# Patient Record
Sex: Female | Born: 1970 | Race: White | Hispanic: No | Marital: Married | Smoking: Former smoker
Health system: Southern US, Community
[De-identification: ages and names within clinical notes are randomized; demographics above are authoritative.]

## PROBLEM LIST (undated history)

## (undated) DIAGNOSIS — F419 Anxiety disorder, unspecified: Secondary | ICD-10-CM

## (undated) DIAGNOSIS — I7774 Dissection of vertebral artery: Secondary | ICD-10-CM

## (undated) DIAGNOSIS — E079 Disorder of thyroid, unspecified: Secondary | ICD-10-CM

## (undated) DIAGNOSIS — F32A Depression, unspecified: Secondary | ICD-10-CM

## (undated) HISTORY — DX: Depression, unspecified: F32.A

## (undated) HISTORY — DX: Disorder of thyroid, unspecified: E07.9

## (undated) HISTORY — DX: Anxiety disorder, unspecified: F41.9

---

## 2020-08-20 DIAGNOSIS — H547 Unspecified visual loss: Secondary | ICD-10-CM | POA: Insufficient documentation

## 2020-08-20 DIAGNOSIS — G43909 Migraine, unspecified, not intractable, without status migrainosus: Secondary | ICD-10-CM | POA: Insufficient documentation

## 2020-08-20 DIAGNOSIS — R112 Nausea with vomiting, unspecified: Secondary | ICD-10-CM | POA: Insufficient documentation

## 2020-10-29 LAB — BASIC METABOLIC PANEL
BUN: 10 (ref 4–21)
CO2: 25 — AB (ref 13–22)
Chloride: 104 (ref 99–108)
Creatinine: 0.8 (ref 0.5–1.1)
Glucose: 102
Potassium: 3.9 (ref 3.4–5.3)
Sodium: 139 (ref 137–147)

## 2020-10-29 LAB — CBC AND DIFFERENTIAL
HCT: 41 (ref 36–46)
Hemoglobin: 13 (ref 12.0–16.0)
Platelets: 375 (ref 150–399)
WBC: 7.5

## 2020-10-29 LAB — CBC: RBC: 4.59 (ref 3.87–5.11)

## 2020-10-29 LAB — COMPREHENSIVE METABOLIC PANEL
Albumin: 4.1 (ref 3.5–5.0)
Calcium: 9.6 (ref 8.7–10.7)

## 2020-10-29 LAB — HEPATIC FUNCTION PANEL: Bilirubin, Total: 0.2

## 2021-02-26 DIAGNOSIS — M542 Cervicalgia: Secondary | ICD-10-CM | POA: Insufficient documentation

## 2021-09-05 ENCOUNTER — Encounter: Payer: Self-pay | Admitting: Medical-Surgical

## 2021-09-05 ENCOUNTER — Ambulatory Visit (INDEPENDENT_AMBULATORY_CARE_PROVIDER_SITE_OTHER): Payer: Federal, State, Local not specified - PPO | Admitting: Medical-Surgical

## 2021-09-05 VITALS — BP 122/84 | HR 80 | Resp 20 | Ht 68.0 in | Wt 187.4 lb

## 2021-09-05 DIAGNOSIS — L989 Disorder of the skin and subcutaneous tissue, unspecified: Secondary | ICD-10-CM | POA: Diagnosis not present

## 2021-09-05 DIAGNOSIS — Z7689 Persons encountering health services in other specified circumstances: Secondary | ICD-10-CM | POA: Diagnosis not present

## 2021-09-05 DIAGNOSIS — Z23 Encounter for immunization: Secondary | ICD-10-CM

## 2021-09-05 DIAGNOSIS — I7774 Dissection of vertebral artery: Secondary | ICD-10-CM | POA: Diagnosis not present

## 2021-09-05 NOTE — Progress Notes (Signed)
New Patient Office Visit  Subjective:  Patient ID: Shelly Walton, female    DOB: 23-Nov-1970  Age: 50 y.o. MRN: [id number]  CC:  Chief Complaint  Patient presents with   Establish Care     HPI Shelly Walton presents to establish care. She is a very pleasant 50 year old female who recently relocated from [place], [place].  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  She has been under regular care by her general practitioner and vascular surgeon over the last year.  She is due for follow-up and has an appointment with Dr. Zella Ball with vascular surgery at [facility] scheduled for next week.  To satisfy insurance, she would like to have a referral placed today.  Reports that she had COVID about 3 months ago.  Since then she has had an increase in hair loss.  She does have a history of hypothyroidism that was previously treated for about 5 years.  She is not currently treated for this.  Has had about 15 pounds weight gain in the last year but wonders if this is related to inactivity or if it is more related to possible thyroid derangement.  Would like to have her thyroid rechecked when she comes back to do her physical.  Noticed a skin lesion that has come up on her right upper chest.  It has been there for only 1 week and is not itchy or painful.  She would like to be referred to dermatology since she has never seen dermatology and would like to have a full skin survey.  History reviewed. No pertinent past medical history.  History reviewed. No pertinent surgical history.  History reviewed. No pertinent family history.  Social History   Socioeconomic History   Marital status: Not on file    Spouse name: Not on file   Number of children: Not on file   Years of education: Not on file   Highest education level: Not on file  Occupational History   Not on file  Tobacco Use   Smoking status: Not on file   Smokeless tobacco: Not on  file  Substance and Sexual Activity   Alcohol use: Not on file   Drug use: Not on file   Sexual activity: Not on file  Other Topics Concern   Not on file  Social History Narrative   Not on file   Social Determinants of Health   Financial Resource Strain: Not on file  Food Insecurity: Not on file  Transportation Needs: Not on file  Physical Activity: Not on file  Stress: Not on file  Social Connections: Not on file  Intimate Partner Violence: Not on file    ROS Review of Systems  Constitutional:  Positive for unexpected weight change. Negative for chills, fatigue and fever.  HENT:  Negative for congestion, rhinorrhea, sinus pressure and sore throat.   Respiratory:  Negative for cough, chest tightness and shortness of breath.   Cardiovascular:  Negative for chest pain, palpitations and leg swelling.  Gastrointestinal:  Negative for abdominal pain, constipation, diarrhea, nausea and vomiting.  Endocrine: Negative for cold intolerance and heat intolerance.  Genitourinary:  Negative for dysuria, frequency, urgency, vaginal bleeding and vaginal discharge.  Skin:  Negative for rash and wound.       Hair loss  Neurological:  Negative for dizziness, light-headedness and headaches.  Hematological:  Does not bruise/bleed easily.  Psychiatric/Behavioral:  Negative for dysphoric mood, self-injury, sleep disturbance and suicidal ideas. The  patient is not nervous/anxious.    Objective:   Today's Vitals: BP 122/84 (BP Location: Right Arm, Cuff Size: Normal)   Pulse 80   Resp 20   Ht 5\' 8"  (1.727 m)   Wt 187 lb 6.4 oz (85 kg)   LMP  (LMP Unknown)   SpO2 96%   BMI 28.49 kg/m   Physical Exam Vitals reviewed.  Constitutional:      General: She is not in acute distress.    Appearance: Normal appearance.  HENT:     Head: Normocephalic and atraumatic.  Cardiovascular:     Rate and Rhythm: Normal rate and regular rhythm.  Pulmonary:     Effort: Pulmonary effort is normal. No  respiratory distress.  Skin:    General: Skin is warm and dry.  Neurological:     Mental Status: She is alert and oriented to person, place, and time.  Psychiatric:        Mood and Affect: Mood normal.        Behavior: Behavior normal.        Thought Content: Thought content normal.        Judgment: Judgment normal.    Assessment & Plan:   1. Encounter to establish care Reviewed available information and discussed care concerns with patient.   2. Vertebral artery dissection [facility]) Referral entered for vascular surgery.  She already has an appointment scheduled so no need for further intervention at this time.  After her follow-up, she will give [facility] a call and let us know if she has been cleared to stop Xarelto.   - Ambulatory referral to Vascular Surgery  3. Skin lesion of chest wall Referring to dermatology per patient request.  Offered shave biopsy here in our office but she would prefer to have a full skin survey. - Ambulatory referral to Dermatology  Outpatient Encounter Medications as of 09/05/2021  Medication Sig   UBRELVY 100 MG TABS Take by mouth.   XARELTO 2.5 MG TABS tablet Take 2.5 mg by mouth 2 (two) times daily.   No facility-administered encounter medications on file as of 09/05/2021.   Follow-up: Return for annual physical exam at your convenience.   13/01/2021, DNP, APRN, FNP-BC [facility] and [facility] Medicine

## 2021-09-11 DIAGNOSIS — I7774 Dissection of vertebral artery: Secondary | ICD-10-CM | POA: Diagnosis not present

## 2021-09-19 DIAGNOSIS — C4401 Basal cell carcinoma of skin of lip: Secondary | ICD-10-CM | POA: Diagnosis not present

## 2021-09-19 DIAGNOSIS — L821 Other seborrheic keratosis: Secondary | ICD-10-CM | POA: Diagnosis not present

## 2021-10-16 DIAGNOSIS — I7774 Dissection of vertebral artery: Secondary | ICD-10-CM | POA: Diagnosis not present

## 2021-12-27 ENCOUNTER — Emergency Department
Admission: RE | Admit: 2021-12-27 | Discharge: 2021-12-27 | Disposition: A | Discharge status: Home / Self Care | Payer: Federal, State, Local not specified - PPO | Source: Ambulatory Visit

## 2021-12-27 ENCOUNTER — Other Ambulatory Visit: Payer: Self-pay

## 2021-12-27 ENCOUNTER — Emergency Department (INDEPENDENT_AMBULATORY_CARE_PROVIDER_SITE_OTHER): Payer: Federal, State, Local not specified - PPO

## 2021-12-27 VITALS — BP 126/83 | HR 75 | Temp 98.8°F | Resp 14

## 2021-12-27 DIAGNOSIS — R079 Chest pain, unspecified: Secondary | ICD-10-CM | POA: Diagnosis not present

## 2021-12-27 HISTORY — DX: Dissection of vertebral artery: I77.74

## 2021-12-27 NOTE — ED Provider Notes (Signed)
Shelly Walton CARE    CSN: [id number] Arrival date & time: 12/27/21  0957      History   Chief Complaint Chief Complaint  Patient presents with   chest irritation    HPI Shelly Walton is a 51 y.o. female.   HPI 51 year old female presents with chest irritation following a cold.  Reports chest irritation began 4 days ago and does not worsen with coughing or breathing.  Reports sensation is not painful.  PMH significant for vertebral artery dissection.  Past Medical History:  Diagnosis Date   Anxiety    Depression    Thyroid disease    Vertebral artery dissection (HCC)     There are no problems to display for this patient.   Past Surgical History:  Procedure Laterality Date   CESAREAN SECTION      OB History   No obstetric history on file.      Home Medications    Prior to Admission medications   Medication Sig Start Date End Date Taking? Authorizing Provider  aspirin 81 MG chewable tablet Chew by mouth daily.   Yes [provider]  atorvastatin (LIPITOR) 10 MG tablet Take 10 mg by mouth daily.   Yes [provider]  UBRELVY 100 MG TABS Take by mouth. 04/10/21   [provider]  XARELTO 2.5 MG TABS tablet Take 2.5 mg by mouth 2 (two) times daily. Patient not taking: Reported on 12/27/2021 08/26/21   [provider]    Family History Family History  Problem Relation Age of Onset   Cancer Mother    Stroke Mother     Social History Social History   Tobacco Use   Smoking status: Former    Packs/day: 1.00    Years: 8.00    Pack years: 8.00    Types: Cigarettes    Quit date: 11/04/2019    Years since quitting: 2.1   Smokeless tobacco: Never  Vaping Use   Vaping Use: Never used  Substance Use Topics   Alcohol use: Yes    Alcohol/week: 2.0 standard drinks    Types: 2 Standard drinks or equivalent per week   Drug use: Not Currently     Allergies   Patient has no known allergies.   Review of  Systems Review of Systems  Respiratory:         Chest irritation x4 days  All other systems reviewed and are negative.   Physical Exam Triage Vital Signs ED Triage Vitals  Enc Vitals Group     BP 12/27/21 1009 126/83     Pulse Rate 12/27/21 1009 75     Resp 12/27/21 1009 14     Temp 12/27/21 1009 98.8 F (37.1 C)     Temp Source 12/27/21 1009 Oral     SpO2 12/27/21 1009 98 %     Weight --      Height --      Head Circumference --      Peak Flow --      Pain Score 12/27/21 1010 0     Pain Loc --      Pain Edu? --      Excl. in GC? --    No data found.  Updated Vital Signs BP 126/83 (BP Location: Left Arm)    Pulse 75    Temp 98.8 F (37.1 C) (Oral)    Resp 14    SpO2 98%     Physical Exam Vitals and nursing note reviewed.  Constitutional:  General: She is not in acute distress.    Appearance: She is obese. She is not ill-appearing.  HENT:     Head: Normocephalic and atraumatic.     Right Ear: Tympanic membrane, ear canal and external ear normal.     Left Ear: Tympanic membrane, ear canal and external ear normal.     Mouth/Throat:     Mouth: Mucous membranes are moist.     Pharynx: Oropharynx is clear.  Eyes:     Extraocular Movements: Extraocular movements intact.     Conjunctiva/sclera: Conjunctivae normal.     Pupils: Pupils are equal, round, and reactive to light.  Cardiovascular:     Rate and Rhythm: Normal rate and regular rhythm.     Pulses: Normal pulses.     Heart sounds: Normal heart sounds. No murmur heard.   No friction rub. No gallop.  Pulmonary:     Effort: Pulmonary effort is normal.     Breath sounds: Normal breath sounds.  Musculoskeletal:     Cervical back: Normal range of motion and neck supple. No tenderness.  Lymphadenopathy:     Cervical: No cervical adenopathy.  Skin:    General: Skin is warm and dry.  Neurological:     General: No focal deficit present.     Mental Status: She is alert and oriented to person, place, and time.  Mental status is at baseline.     UC Treatments / Results  Labs (all labs ordered are listed, but only abnormal results are displayed) Labs Reviewed - No data to display  EKG   Radiology DG Chest 2 View  Result Date: 12/27/2021 CLINICAL DATA:  Nonspecific chest pain EXAM: CHEST - 2 VIEW COMPARISON:  None. FINDINGS: No consolidation. No visible pleural effusions or pneumothorax. Cardiac silhouette is within normal limits. IMPRESSION: No evidence of acute cardiopulmonary disease. Electronically Signed   By: Feliberto Harts M.D.   On: 12/27/2021 10:58    Procedures Procedures (including critical care time)  Medications Ordered in UC Medications - No data to display  Initial Impression / Assessment and Plan / UC Course  I have reviewed the triage vital signs and the nursing notes.  Pertinent labs & imaging results that were available during my care of the patient were reviewed by me and considered in my medical decision making (see chart for details).     MDM: 1.  Nonspecific chest pain-EKG revealed normal sinus rhythm with sinus arrhythmia, borderline EKG, CXR revealed no evidence of acute cardiopulmonary disease. Advised patient of today's results from EKG and chest x-ray.  Advised patient if symptoms worsen and/or unresolved please follow-up with PCP for further evaluation.  Patient discharged home, hemodynamically stable. Final Clinical Impressions(s) / UC Diagnoses   Final diagnoses:  Nonspecific chest pain     Discharge Instructions      Advised patient of today's results from EKG and chest x-ray.  Advised patient if symptoms worsen and/or unresolved please follow-up with PCP for further evaluation.     ED Prescriptions   None    PDMP not reviewed this encounter.   Trevor Iha, FNP 12/27/21 1111

## 2021-12-27 NOTE — Discharge Instructions (Addendum)
Advised patient of today's results from EKG and chest x-ray.  Advised patient if symptoms worsen and/or unresolved please follow-up with PCP for further evaluation.

## 2021-12-27 NOTE — ED Triage Notes (Signed)
Pt added she does have continued pain in the back of her head beginning tuesday.

## 2021-12-27 NOTE — ED Triage Notes (Signed)
Pt presents with "chest irritation" following a cold. Pt states the irritation began Monday and does not worsen with coughing or breathing. Pt states the sensation is not painful. Of note, Pt states she does have a vertebral artery dissection.

## 2022-01-13 ENCOUNTER — Ambulatory Visit (INDEPENDENT_AMBULATORY_CARE_PROVIDER_SITE_OTHER): Payer: Federal, State, Local not specified - PPO | Admitting: Medical-Surgical

## 2022-01-13 ENCOUNTER — Other Ambulatory Visit: Payer: Self-pay

## 2022-01-13 ENCOUNTER — Encounter: Payer: Self-pay | Admitting: Medical-Surgical

## 2022-01-13 VITALS — BP 112/79 | HR 69 | Resp 20 | Ht 68.0 in | Wt 189.3 lb

## 2022-01-13 DIAGNOSIS — E039 Hypothyroidism, unspecified: Secondary | ICD-10-CM

## 2022-01-13 DIAGNOSIS — Z0000 Encounter for general adult medical examination without abnormal findings: Secondary | ICD-10-CM | POA: Diagnosis not present

## 2022-01-13 DIAGNOSIS — Z87898 Personal history of other specified conditions: Secondary | ICD-10-CM

## 2022-01-13 DIAGNOSIS — Z1211 Encounter for screening for malignant neoplasm of colon: Secondary | ICD-10-CM

## 2022-01-13 DIAGNOSIS — E282 Polycystic ovarian syndrome: Secondary | ICD-10-CM

## 2022-01-13 DIAGNOSIS — Z1231 Encounter for screening mammogram for malignant neoplasm of breast: Secondary | ICD-10-CM | POA: Diagnosis not present

## 2022-01-13 HISTORY — DX: Hypothyroidism, unspecified: E03.9

## 2022-01-13 HISTORY — DX: Polycystic ovarian syndrome: E28.2

## 2022-01-13 NOTE — Patient Instructions (Signed)

## 2022-01-13 NOTE — Progress Notes (Signed)
HPI: Shelly Walton is a 51 y.o. female who  has a past medical history of Anxiety, Depression, Thyroid disease, and Vertebral artery dissection ([place]).  she presents to [facility] today, 01/13/22,  for chief complaint of: Annual physical exam  Dentist: UTD, every 6 months Eye exam: UTD Exercise: None intentional Diet: No restrictions, working on healthier choices Pap smear: Due next year Mammogram: Ordering today Colon cancer screening: Referring to GI COVID vaccine: Declined  Concerns: Wants thyroid checked, is concerned with weight gain/difficulty losing weight  Past medical, surgical, social and family history reviewed:  Patient Active Problem List   Diagnosis Date Noted   Hypothyroidism 01/13/2022   Polycystic ovarian syndrome 01/13/2022   Vertebral artery dissection ([place]) 09/11/2021    Past Surgical History:  Procedure Laterality Date   CESAREAN SECTION      Social History   Tobacco Use   Smoking status: Former    Packs/day: 1.00    Years: 8.00    Pack years: 8.00    Types: Cigarettes    Quit date: 11/04/2019    Years since quitting: 2.1   Smokeless tobacco: Never  Substance Use Topics   Alcohol use: Yes    Alcohol/week: 2.0 standard drinks    Types: 2 Standard drinks or equivalent per week    Family History  Problem Relation Age of Onset   Cancer Mother    Stroke Mother      Current medication list and allergy/intolerance information reviewed:    Current Outpatient Medications  Medication Sig Dispense Refill   aspirin 81 MG chewable tablet Chew by mouth daily.     atorvastatin (LIPITOR) 10 MG tablet Take 10 mg by mouth daily.     UBRELVY 100 MG TABS Take by mouth.     No current facility-administered medications for this visit.    No Known Allergies    Review of Systems: Constitutional:  No  fever, no chills, No recent illness, + unintentional weight changes. No significant fatigue.  HEENT: No   headache, no vision change, no hearing change, No sore throat, No  sinus pressure Cardiac: No  chest pain, No  pressure, No palpitations, No  Orthopnea Respiratory:  No  shortness of breath. No  Cough Gastrointestinal: No  abdominal pain, No  nausea, No  vomiting,  No  blood in stool, No  diarrhea, No  constipation  Musculoskeletal: No new myalgia/arthralgia Skin: No  Rash, No other wounds/concerning lesions, + hair loss Genitourinary: No  incontinence, No  abnormal genital bleeding, No abnormal genital discharge Hem/Onc: No  easy bruising/bleeding, No  abnormal lymph node Endocrine: + cold intolerance,  No heat intolerance. No polyuria/polydipsia/polyphagia  Neurologic: No  weakness, No  dizziness, No  slurred speech/focal weakness/facial droop Psychiatric: No  concerns with depression, No  concerns with anxiety, No sleep problems, No mood problems  Exam:  BP 112/79 (BP Location: Right Arm, Cuff Size: Normal)    Pulse 69    Resp 20    Ht 5' 8"  (1.727 m)    Wt 189 lb 4.8 oz (85.9 kg)    SpO2 99%    BMI 28.78 kg/m  Constitutional: VS see above. General Appearance: alert, well-developed, well-nourished, NAD Eyes: Normal lids and conjunctive, non-icteric sclera Ears, Nose, Mouth, Throat: MMM, Normal external inspection ears/nares/mouth/lips/gums. TM normal bilaterally.  Neck: No masses, trachea midline. No thyroid enlargement. No tenderness/mass appreciated. No lymphadenopathy Respiratory: Normal respiratory effort. no wheeze, no rhonchi, no rales Cardiovascular: S1/S2 normal, no murmur,  no rub/gallop auscultated. RRR. No lower extremity edema. Pedal pulse II/IV bilaterally PT. No carotid bruit or JVD. No abdominal aortic bruit. Gastrointestinal: Nontender, no masses. No hepatomegaly, no splenomegaly. No hernia appreciated. Bowel sounds normal. Rectal exam deferred.  Musculoskeletal: Gait normal. No clubbing/cyanosis of digits.  Neurological: Normal balance/coordination. No tremor. No cranial  nerve deficit on limited exam. Motor and sensation intact and symmetric. Cerebellar reflexes intact.  Skin: warm, dry, intact. No rash/ulcer. No concerning nevi or subq nodules on limited exam.   Psychiatric: Normal judgment/insight. Normal mood and affect. Oriented x3.    ASSESSMENT/PLAN:   1. Annual physical exam Checking labs as below.  Up-to-date on preventative care.  Wellness information provided with AVS. - CBC with Differential/Platelet - COMPLETE METABOLIC PANEL WITH GFR - Lipid Panel w/reflex Direct LDL  2. History of prediabetes Checking hemoglobin A1c. - Hemoglobin A1c  3. Encounter for screening mammogram for malignant neoplasm of breast Mammogram ordered. - MM 3D SCREEN BREAST BILATERAL; Future  4. Colon cancer screening Referring to GI for colonoscopy. - Ambulatory referral to Gastroenterology  5. Hypothyroidism, unspecified type Checking TSH. - TSH    Orders Placed This Encounter  Procedures   CBC with Differential/Platelet   COMPLETE METABOLIC PANEL WITH GFR   Lipid Panel w/reflex Direct LDL   TSH   Hemoglobin A1c    No orders of the defined types were placed in this encounter.   Patient Instructions  Preventive Care 45-60 Years Old, Female Preventive care refers to lifestyle choices and visits with your health care provider that can promote health and wellness. Preventive care visits are also called wellness exams. What can I expect for my preventive care visit? Counseling Your health care provider may ask you questions about your: Medical history, including: Past medical problems. Family medical history. Pregnancy history. Current health, including: Menstrual cycle. Method of birth control. Emotional well-being. Home life and relationship well-being. Sexual activity and sexual health. Lifestyle, including: Alcohol, nicotine or tobacco, and drug use. Access to firearms. Diet, exercise, and sleep habits. Work and work  Statistician. Sunscreen use. Safety issues such as seatbelt and bike helmet use. Physical exam Your health care provider will check your: Height and weight. These may be used to calculate your BMI (body mass index). BMI is a measurement that tells if you are at a healthy weight. Waist circumference. This measures the distance around your waistline. This measurement also tells if you are at a healthy weight and may help predict your risk of certain diseases, such as type 2 diabetes and high blood pressure. Heart rate and blood pressure. Body temperature. Skin for abnormal spots. What immunizations do I need? Vaccines are usually given at various ages, according to a schedule. Your health care provider will recommend vaccines for you based on your age, medical history, and lifestyle or other factors, such as travel or where you work. What tests do I need? Screening Your health care provider may recommend screening tests for certain conditions. This may include: Lipid and cholesterol levels. Diabetes screening. This is done by checking your blood sugar (glucose) after you have not eaten for a while (fasting). Pelvic exam and Pap test. Hepatitis B test. Hepatitis C test. HIV (human immunodeficiency virus) test. STI (sexually transmitted infection) testing, if you are at risk. Lung cancer screening. Colorectal cancer screening. Mammogram. Talk with your health care provider about when you should start having regular mammograms. This may depend on whether you have a family history of breast cancer. BRCA-related cancer screening.  This may be done if you have a family history of breast, ovarian, tubal, or peritoneal cancers. Bone density scan. This is done to screen for osteoporosis. Talk with your health care provider about your test results, treatment options, and if necessary, the need for more tests. Follow these instructions at home: Eating and drinking  Eat a diet that includes fresh  fruits and vegetables, whole grains, lean protein, and low-fat dairy products. Take vitamin and mineral supplements as recommended by your health care provider. Do not drink alcohol if: Your health care provider tells you not to drink. You are pregnant, may be pregnant, or are planning to become pregnant. If you drink alcohol: Limit how much you have to 0-1 drink a day. Know how much alcohol is in your drink. In the [place] one drink equals one 12 oz bottle of beer (355 mL), one 5 oz glass of wine (148 mL), or one 1 oz glass of hard liquor (44 mL). Lifestyle Brush your teeth every morning and night with fluoride toothpaste. Floss one time each day. Exercise for at least 30 minutes 5 or more days each week. Do not use any products that contain nicotine or tobacco. These products include cigarettes, chewing tobacco, and vaping devices, such as e-cigarettes. If you need help quitting, ask your health care provider. Do not use drugs. If you are sexually active, practice safe sex. Use a condom or other form of protection to prevent STIs. If you do not wish to become pregnant, use a form of birth control. If you plan to become pregnant, see your health care provider for a prepregnancy visit. Take aspirin only as told by your health care provider. Make sure that you understand how much to take and what form to take. Work with your health care provider to find out whether it is safe and beneficial for you to take aspirin daily. Find healthy ways to manage stress, such as: Meditation, yoga, or listening to music. Journaling. Talking to a trusted person. Spending time with friends and family. Minimize exposure to UV radiation to reduce your risk of skin cancer. Safety Always wear your seat belt while driving or riding in a vehicle. Do not drive: If you have been drinking alcohol. Do not ride with someone who has been drinking. When you are tired or distracted. While texting. If you have been using  any mind-altering substances or drugs. Wear a helmet and other protective equipment during sports activities. If you have firearms in your house, make sure you follow all gun safety procedures. Seek help if you have been physically or sexually abused. What's next? Visit your health care provider once a year for an annual wellness visit. Ask your health care provider how often you should have your eyes and teeth checked. Stay up to date on all vaccines. This information is not intended to replace advice given to you by your health care provider. Make sure you discuss any questions you have with your health care provider. Document Revised: 04/17/2021 Document Reviewed: 04/17/2021 Elsevier Patient Education  [place].   Follow-up plan: Return in about 1 year (around 01/14/2023) for annual physical exam or sooner if needed.  Clearnce Sorrel, DNP, APRN, FNP-BC [facility] and [facility]

## 2022-01-14 ENCOUNTER — Encounter: Payer: Self-pay | Admitting: Medical-Surgical

## 2022-01-14 LAB — LIPID PANEL W/REFLEX DIRECT LDL
Cholesterol: 147 mg/dL (ref <200)
HDL: 68 mg/dL (ref >=50)
LDL Cholesterol (Calc): 66 mg/dL (calc)
Non-HDL Cholesterol (Calc): 79 mg/dL (calc) (ref <130)
Total CHOL/HDL Ratio: 2.2 (calc) (ref <5.0)
Triglycerides: 58 mg/dL (ref <150)

## 2022-01-14 LAB — COMPLETE METABOLIC PANEL WITH GFR
AG Ratio: 1.7 (calc) (ref 1.0–2.5)
ALT: 20 U/L (ref 6–29)
AST: 21 U/L (ref 10–35)
Albumin: 4.2 g/dL (ref 3.6–5.1)
Alkaline phosphatase (APISO): 47 U/L (ref 31–125)
BUN: 8 mg/dL (ref 7–25)
CO2: 28 mmol/L (ref 20–32)
Calcium: 9.3 mg/dL (ref 8.6–10.2)
Chloride: 105 mmol/L (ref 98–110)
Creat: 0.8 mg/dL (ref 0.50–0.99)
Globulin: 2.5 g/dL (calc) (ref 1.9–3.7)
Glucose, Bld: 87 mg/dL (ref 65–99)
Potassium: 4.6 mmol/L (ref 3.5–5.3)
Sodium: 140 mmol/L (ref 135–146)
Total Bilirubin: 0.4 mg/dL (ref 0.2–1.2)
Total Protein: 6.7 g/dL (ref 6.1–8.1)
eGFR: 91 mL/min/{1.73_m2} (ref >=60)

## 2022-01-14 LAB — CBC WITH DIFFERENTIAL/PLATELET
Absolute Monocytes: 582 cells/uL (ref 200–950)
Basophils Absolute: 60 cells/uL (ref 0–200)
Basophils Relative: 1 %
Eosinophils Absolute: 78 cells/uL (ref 15–500)
Eosinophils Relative: 1.3 %
HCT: 39.6 % (ref 35.0–45.0)
Hemoglobin: 12.5 g/dL (ref 11.7–15.5)
Lymphs Abs: 1704 cells/uL (ref 850–3900)
MCH: 24.3 pg — ABNORMAL LOW (ref 27.0–33.0)
MCHC: 31.6 g/dL — ABNORMAL LOW (ref 32.0–36.0)
MCV: 76.9 fL — ABNORMAL LOW (ref 80.0–100.0)
MPV: 9.2 fL (ref 7.5–12.5)
Monocytes Relative: 9.7 %
Neutro Abs: 3576 cells/uL (ref 1500–7800)
Neutrophils Relative %: 59.6 %
Platelets: 419 10*3/uL — ABNORMAL HIGH (ref 140–400)
RBC: 5.15 10*6/uL — ABNORMAL HIGH (ref 3.80–5.10)
RDW: 14.6 % (ref 11.0–15.0)
Total Lymphocyte: 28.4 %
WBC: 6 10*3/uL (ref 3.8–10.8)

## 2022-01-14 LAB — HEMOGLOBIN A1C
Hgb A1c MFr Bld: 5.5 % of total Hgb (ref <5.7)
Mean Plasma Glucose: 111 mg/dL
eAG (mmol/L): 6.2 mmol/L

## 2022-01-14 LAB — TSH: TSH: 2.41 mIU/L

## 2022-01-23 ENCOUNTER — Other Ambulatory Visit: Payer: Self-pay

## 2022-01-23 DIAGNOSIS — D509 Iron deficiency anemia, unspecified: Secondary | ICD-10-CM

## 2022-01-23 DIAGNOSIS — R718 Other abnormality of red blood cells: Secondary | ICD-10-CM

## 2022-01-23 NOTE — Telephone Encounter (Signed)
Attempted to contact pt, but had to LVM.  Shelly Walton, CMA ?

## 2022-01-23 NOTE — Telephone Encounter (Signed)
Spoke with pt and advised of lab details and location.  Pt expressed understanding.  Tiajuana Amass, CMA ?

## 2022-01-24 DIAGNOSIS — R718 Other abnormality of red blood cells: Secondary | ICD-10-CM | POA: Diagnosis not present

## 2022-01-24 DIAGNOSIS — D509 Iron deficiency anemia, unspecified: Secondary | ICD-10-CM | POA: Diagnosis not present

## 2022-01-25 LAB — IRON,TIBC AND FERRITIN PANEL
%SAT: 18 % (calc) (ref 16–45)
Ferritin: 2 ng/mL — ABNORMAL LOW (ref 16–232)
Iron: 85 ug/dL (ref 40–190)
TIBC: 464 mcg/dL (calc) — ABNORMAL HIGH (ref 250–450)

## 2022-01-28 ENCOUNTER — Encounter: Payer: Self-pay | Admitting: Medical-Surgical

## 2022-03-13 ENCOUNTER — Ambulatory Visit: Payer: Federal, State, Local not specified - PPO

## 2022-03-27 ENCOUNTER — Ambulatory Visit (INDEPENDENT_AMBULATORY_CARE_PROVIDER_SITE_OTHER): Payer: Federal, State, Local not specified - PPO

## 2022-03-27 DIAGNOSIS — Z1231 Encounter for screening mammogram for malignant neoplasm of breast: Secondary | ICD-10-CM

## 2022-04-04 ENCOUNTER — Encounter: Payer: Self-pay | Admitting: Medical-Surgical

## 2022-04-15 ENCOUNTER — Other Ambulatory Visit: Payer: Self-pay | Admitting: Medical-Surgical

## 2022-04-15 DIAGNOSIS — R928 Other abnormal and inconclusive findings on diagnostic imaging of breast: Secondary | ICD-10-CM

## 2022-04-23 ENCOUNTER — Ambulatory Visit
Admission: RE | Admit: 2022-04-23 | Discharge: 2022-04-23 | Disposition: A | Discharge status: Home / Self Care | Payer: Federal, State, Local not specified - PPO | Source: Ambulatory Visit | Attending: Medical-Surgical | Admitting: Medical-Surgical

## 2022-04-23 DIAGNOSIS — N6489 Other specified disorders of breast: Secondary | ICD-10-CM | POA: Diagnosis not present

## 2022-04-23 DIAGNOSIS — R928 Other abnormal and inconclusive findings on diagnostic imaging of breast: Secondary | ICD-10-CM

## 2022-04-24 ENCOUNTER — Other Ambulatory Visit: Payer: Self-pay | Admitting: Medical-Surgical

## 2022-04-24 DIAGNOSIS — N6310 Unspecified lump in the right breast, unspecified quadrant: Secondary | ICD-10-CM

## 2022-04-28 ENCOUNTER — Encounter: Payer: Self-pay | Admitting: Medical-Surgical

## 2022-04-28 DIAGNOSIS — D509 Iron deficiency anemia, unspecified: Secondary | ICD-10-CM

## 2022-05-20 DIAGNOSIS — D509 Iron deficiency anemia, unspecified: Secondary | ICD-10-CM | POA: Diagnosis not present

## 2022-05-21 LAB — IRON,TIBC AND FERRITIN PANEL
%SAT: 32 % (calc) (ref 16–45)
Ferritin: 20 ng/mL (ref 16–232)
Iron: 113 ug/dL (ref 40–190)
TIBC: 356 mcg/dL (calc) (ref 250–450)

## 2022-05-21 LAB — CBC WITH DIFFERENTIAL/PLATELET
Absolute Monocytes: 718 cells/uL (ref 200–950)
Basophils Absolute: 41 cells/uL (ref 0–200)
Basophils Relative: 0.6 %
Eosinophils Absolute: 104 cells/uL (ref 15–500)
Eosinophils Relative: 1.5 %
HCT: 43.2 % (ref 35.0–45.0)
Hemoglobin: 14.6 g/dL (ref 11.7–15.5)
Lymphs Abs: 1732 cells/uL (ref 850–3900)
MCH: 28.9 pg (ref 27.0–33.0)
MCHC: 33.8 g/dL (ref 32.0–36.0)
MCV: 85.5 fL (ref 80.0–100.0)
MPV: 9.3 fL (ref 7.5–12.5)
Monocytes Relative: 10.4 %
Neutro Abs: 4306 cells/uL (ref 1500–7800)
Neutrophils Relative %: 62.4 %
Platelets: 330 10*3/uL (ref 140–400)
RBC: 5.05 10*6/uL (ref 3.80–5.10)
RDW: 13 % (ref 11.0–15.0)
Total Lymphocyte: 25.1 %
WBC: 6.9 10*3/uL (ref 3.8–10.8)

## 2022-07-29 ENCOUNTER — Ambulatory Visit: Admit: 2022-07-29 | Discharge status: Absent without leave | Payer: Federal, State, Local not specified - PPO

## 2022-07-29 ENCOUNTER — Encounter: Payer: Self-pay | Admitting: Emergency Medicine

## 2022-07-29 ENCOUNTER — Ambulatory Visit
Admission: EM | Admit: 2022-07-29 | Discharge: 2022-07-29 | Disposition: A | Discharge status: Home / Self Care | Payer: Federal, State, Local not specified - PPO | Attending: Family Medicine | Admitting: Family Medicine

## 2022-07-29 DIAGNOSIS — T148XXA Other injury of unspecified body region, initial encounter: Secondary | ICD-10-CM

## 2022-07-29 DIAGNOSIS — L089 Local infection of the skin and subcutaneous tissue, unspecified: Secondary | ICD-10-CM | POA: Diagnosis not present

## 2022-07-29 MED ORDER — CEPHALEXIN 500 MG PO CAPS: 500.0000 mg | ORAL_CAPSULE | Freq: Three times a day (TID) | ORAL | 0 refills | Status: DC

## 2022-07-29 MED ORDER — FLUCONAZOLE 150 MG PO TABS: 150.0000 mg | ORAL_TABLET | Freq: Every day | ORAL | 0 refills | Status: DC

## 2022-07-29 NOTE — Discharge Instructions (Signed)
Area daily, apply antibiotic ointment and Band-Aid Take Keflex 3 times a day Call or return for problems

## 2022-07-29 NOTE — ED Provider Notes (Signed)
[facility]    CSN: [id number] Arrival date & time: 07/29/22  1553      History   Chief Complaint Chief Complaint  Patient presents with   Sore on right ankle    HPI Shelly Walton is a 51 y.o. female.   HPI  Wound on ankle from dog leash pulling across skin.  Its been there for about a week P.  Becoming more painful and swollen.  Last tetanus was 7 years ago.  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      Home Medications    Prior to Admission medications   Medication Sig Start Date End Date Taking? Authorizing Provider  aspirin 81 MG chewable tablet Chew by mouth daily.   Yes [provider]  atorvastatin (LIPITOR) 10 MG tablet Take 10 mg by mouth daily.   Yes [provider]  cephALEXin (KEFLEX) 500 MG capsule Take 1 capsule (500 mg total) by mouth 3 (three) times daily. 07/29/22  Yes Eustace Moore, MD  Ferrous Sulfate (IRON PO) Take by mouth.   Yes [provider]  UBRELVY 100 MG TABS Take by mouth. 04/10/21  Yes [provider]    Family History Family History  Problem Relation Age of Onset   Cancer Mother    Stroke Mother     Social History Social History   Tobacco Use   Smoking status: Former    Packs/day: 1.00    Years: 8.00    Total pack years: 8.00    Types: Cigarettes    Quit date: 11/04/2019    Years since quitting: 2.7   Smokeless tobacco: Never  Vaping Use   Vaping Use: Never used  Substance Use Topics   Alcohol use: Yes    Alcohol/week: 2.0 standard drinks of alcohol    Types: 2 Standard drinks or  equivalent per week   Drug use: Not Currently     Allergies   Patient has no known allergies.   Review of Systems Review of Systems See HPI  Physical Exam Triage Vital Signs ED Triage Vitals  Enc Vitals Group     BP 07/29/22 1607 132/84     Pulse Rate 07/29/22 1607 75     Resp 07/29/22 1607 18     Temp 07/29/22 1607 98.6 F (37 C)     Temp Source 07/29/22 1607 Oral     SpO2 07/29/22 1607 97 %     Weight 07/29/22 1608 189 lb 6 oz (85.9 kg)     Height 07/29/22 1608 5\' 8"  (1.727 m)     Head Circumference --      Peak Flow --      Pain Score 07/29/22 1608 2     Pain Loc --      Pain Edu? --      Excl. in GC? --    No data found.  Updated Vital Signs BP 132/84 (BP Location: Right Arm)   Pulse 75   Temp 98.6 F (37 C) (Oral)   Resp 18  Ht 5\' 8"  (1.727 m)   Wt 85.9 kg   SpO2 97%   BMI 28.79 kg/m   Physical Exam Constitutional:      General: She is not in acute distress.    Appearance: She is well-developed.  HENT:     Head: Normocephalic and atraumatic.  Eyes:     Conjunctiva/sclera: Conjunctivae normal.     Pupils: Pupils are equal, round, and reactive to light.  Cardiovascular:     Rate and Rhythm: Normal rate.  Pulmonary:     Effort: Pulmonary effort is normal. No respiratory distress.  Abdominal:     General: There is no distension.     Palpations: Abdomen is soft.  Musculoskeletal:        General: Normal range of motion.     Cervical back: Normal range of motion.  Skin:    General: Skin is warm and dry.     Findings: Lesion present.     Comments: On right ankle just above the lateral malleolus there is a 2 x 3 cm eschar with erythema surrounding that is tender.  Some soft tissue swelling inferior.  Neurological:     Mental Status: She is alert.      UC Treatments / Results  Labs (all labs ordered are listed, but only abnormal results are displayed) Labs Reviewed - No data to display  EKG   Radiology No results  found.  Procedures Procedures (including critical care time)  Medications Ordered in UC Medications - No data to display  Initial Impression / Assessment and Plan / UC Course  I have reviewed the triage vital signs and the nursing notes.  Pertinent labs & imaging results that were available during my care of the patient were reviewed by me and considered in my medical decision making (see chart for details).    Tetanus up-to-date 7 years ago Final Clinical Impressions(s) / UC Diagnoses   Final diagnoses:  Infected wound     Discharge Instructions      Area daily, apply antibiotic ointment and Band-Aid Take Keflex 3 times a day Call or return for problems   ED Prescriptions     Medication Sig Dispense Auth. Provider   cephALEXin (KEFLEX) 500 MG capsule Take 1 capsule (500 mg total) by mouth 3 (three) times daily. 21 capsule Raylene Everts, MD      PDMP not reviewed this encounter.   Raylene Everts, MD 07/29/22 [phone]

## 2022-07-29 NOTE — ED Triage Notes (Signed)
Patient states that she has a "sore" on her right lower ankle x 1 week.  Her dogs leash got wrapped around her ankle.  Patient has applied Betadine to the area.  The area is painful and at times itchy.

## 2022-07-30 ENCOUNTER — Telehealth: Payer: Self-pay

## 2022-07-30 NOTE — Telephone Encounter (Signed)
Called to check on pt status since UC visit. Left msg advising call back if any questions or concerns.

## 2022-10-13 ENCOUNTER — Encounter: Payer: Self-pay | Admitting: Medical-Surgical

## 2023-03-27 ENCOUNTER — Other Ambulatory Visit (HOSPITAL_COMMUNITY)
Admission: RE | Admit: 2023-03-27 | Discharge: 2023-03-27 | Disposition: A | Discharge status: Home / Self Care | Payer: Federal, State, Local not specified - PPO | Source: Ambulatory Visit | Attending: Medical-Surgical | Admitting: Medical-Surgical

## 2023-03-27 ENCOUNTER — Encounter: Payer: Self-pay | Admitting: Medical-Surgical

## 2023-03-27 ENCOUNTER — Ambulatory Visit (INDEPENDENT_AMBULATORY_CARE_PROVIDER_SITE_OTHER): Payer: Federal, State, Local not specified - PPO | Admitting: Medical-Surgical

## 2023-03-27 VITALS — BP 123/80 | HR 76 | Resp 20 | Ht 68.0 in | Wt 192.5 lb

## 2023-03-27 DIAGNOSIS — I7774 Dissection of vertebral artery: Secondary | ICD-10-CM

## 2023-03-27 DIAGNOSIS — Z124 Encounter for screening for malignant neoplasm of cervix: Secondary | ICD-10-CM

## 2023-03-27 DIAGNOSIS — Z1231 Encounter for screening mammogram for malignant neoplasm of breast: Secondary | ICD-10-CM

## 2023-03-27 DIAGNOSIS — Z0000 Encounter for general adult medical examination without abnormal findings: Secondary | ICD-10-CM | POA: Diagnosis not present

## 2023-03-27 DIAGNOSIS — F988 Other specified behavioral and emotional disorders with onset usually occurring in childhood and adolescence: Secondary | ICD-10-CM

## 2023-03-27 DIAGNOSIS — N898 Other specified noninflammatory disorders of vagina: Secondary | ICD-10-CM | POA: Diagnosis not present

## 2023-03-27 DIAGNOSIS — Z1211 Encounter for screening for malignant neoplasm of colon: Secondary | ICD-10-CM | POA: Diagnosis not present

## 2023-03-27 MED ORDER — BUPROPION HCL ER (XL) 150 MG PO TB24: 150.0000 mg | ORAL_TABLET | Freq: Every day | ORAL | 0 refills | Status: DC

## 2023-03-27 MED ORDER — TRIAMCINOLONE ACETONIDE 0.1 % EX CREA: 1.0000 | TOPICAL_CREAM | Freq: Two times a day (BID) | CUTANEOUS | 0 refills | Status: DC

## 2023-03-27 MED ORDER — ATORVASTATIN CALCIUM 10 MG PO TABS
10.0000 mg | ORAL_TABLET | Freq: Every day | ORAL | 0 refills | Status: DC
Start: 2023-03-27 — End: 2023-06-22

## 2023-03-27 NOTE — Progress Notes (Signed)
Complete physical exam  Patient: Shelly Walton   DOB: Feb 04, 1971   52 y.o. Female  MRN: [id number]  Subjective:    Chief Complaint  Patient presents with   Annual Exam   Gynecologic Exam    Shelly Walton is a 52 y.o. female who presents today for a complete physical exam. She reports consuming a general diet. The patient does not participate in regular exercise at present. She generally feels fairly well. She reports sleeping fairly well. She does have additional problems to discuss today.    Most recent fall risk assessment:    03/27/2023    3:31 PM  Fall Risk   Falls in the past year? 1  Number falls in past yr: 1  Injury with Fall? 0  Risk for fall due to : History of fall(s)  Follow up Falls evaluation completed     Most recent depression screenings:    03/27/2023    3:31 PM 09/05/2021    9:42 AM  PHQ 2/9 Scores  PHQ - 2 Score 2 0  PHQ- 9 Score 11     Vision:Within last year, Dental: No current dental problems, and STD: The patient denies history of sexually transmitted disease.    Patient Care Team: Christen Butter, NP as PCP - General (Nurse Practitioner)   Outpatient Medications Prior to Visit  Medication Sig   aspirin 81 MG chewable tablet Chew by mouth daily.   Ferrous Sulfate (IRON PO) Take 45 mg by mouth daily.   UBRELVY 100 MG TABS Take by mouth.   [DISCONTINUED] atorvastatin (LIPITOR) 10 MG tablet Take 10 mg by mouth daily.   [DISCONTINUED] cephALEXin (KEFLEX) 500 MG capsule Take 1 capsule (500 mg total) by mouth 3 (three) times daily.   [DISCONTINUED] fluconazole (DIFLUCAN) 150 MG tablet Take 1 tablet (150 mg total) by mouth daily. Repeat in 1 week if needed   No facility-administered medications prior to visit.    Review of Systems  Constitutional:  Positive for malaise/fatigue. Negative for chills, fever and weight loss.  HENT:  Negative for congestion, ear pain, hearing loss, sinus pain and sore throat.   Eyes:  Negative for blurred  vision, photophobia and pain.  Respiratory:  Positive for cough and sputum production. Negative for shortness of breath and wheezing.   Cardiovascular:  Negative for chest pain, palpitations and leg swelling.  Gastrointestinal:  Negative for abdominal pain, constipation, diarrhea, heartburn, nausea and vomiting.  Genitourinary:  Negative for dysuria, frequency and urgency.       Vaginal itching, dyspareunia  Musculoskeletal:  Negative for falls and neck pain.  Skin:  Negative for itching and rash.  Neurological:  Negative for dizziness, weakness and headaches.  Endo/Heme/Allergies:  Negative for polydipsia. Does not bruise/bleed easily.  Psychiatric/Behavioral:  Positive for depression. Negative for substance abuse and suicidal ideas. The patient is nervous/anxious.      Objective:    BP 123/80 (BP Location: Right Arm, Cuff Size: Normal)   Pulse 76   Resp 20   Ht 5\' 8"  (1.727 m)   Wt 192 lb 8 oz (87.3 kg)   SpO2 97%   BMI 29.27 kg/m    Physical Exam Exam conducted with a chaperone present.  Constitutional:      General: She is not in acute distress.    Appearance: Normal appearance. She is not ill-appearing.  HENT:     Head: Normocephalic and atraumatic.     Right Ear: Tympanic membrane, ear canal and external ear normal.  There is no impacted cerumen.     Left Ear: Tympanic membrane, ear canal and external ear normal. There is no impacted cerumen.     Nose: Nose normal. No congestion or rhinorrhea.     Mouth/Throat:     Mouth: Mucous membranes are moist.     Pharynx: No oropharyngeal exudate or posterior oropharyngeal erythema.  Eyes:     General: No scleral icterus.       Right eye: No discharge.        Left eye: No discharge.     Extraocular Movements: Extraocular movements intact.     Conjunctiva/sclera: Conjunctivae normal.     Pupils: Pupils are equal, round, and reactive to light.  Neck:     Thyroid: No thyromegaly.     Vascular: No carotid bruit or JVD.      Trachea: Trachea normal.  Cardiovascular:     Rate and Rhythm: Normal rate and regular rhythm.     Pulses: Normal pulses.     Heart sounds: Normal heart sounds. No murmur heard.    No friction rub. No gallop.  Pulmonary:     Effort: Pulmonary effort is normal. No respiratory distress.     Breath sounds: Normal breath sounds. No wheezing.  Abdominal:     General: Bowel sounds are normal. There is no distension.     Palpations: Abdomen is soft.     Tenderness: There is no abdominal tenderness. There is no guarding.  Genitourinary:    General: Normal vulva.     Exam position: Lithotomy position.     Vagina: Vaginal discharge present.     Cervix: Normal.     Uterus: Normal.      Adnexa: Right adnexa normal and left adnexa normal.     Comments: Erythema to the inner labia minora bilaterally and into the vaginal introitus.  Musculoskeletal:        General: Normal range of motion.     Cervical back: Normal range of motion and neck supple.  Lymphadenopathy:     Cervical: No cervical adenopathy.  Skin:    General: Skin is warm and dry.  Neurological:     Mental Status: She is alert and oriented to person, place, and time.     Cranial Nerves: No cranial nerve deficit.  Psychiatric:        Mood and Affect: Mood normal.        Behavior: Behavior normal.        Thought Content: Thought content normal.        Judgment: Judgment normal.   No results found for any visits on 03/27/23.     Assessment & Plan:    Routine Health Maintenance and Physical Exam  Immunization History  Administered Date(s) Administered   Influenza,inj,Quad PF,6+ Mos 09/05/2021    Health Maintenance  Topic Date Due   HIV Screening  Never done   Hepatitis C Screening  Never done   DTaP/Tdap/Td (1 - Tdap) Never done   PAP SMEAR-Modifier  Never done   Colonoscopy  Never done   Zoster Vaccines- Shingrix (1 of 2) Never done   INFLUENZA VACCINE  06/04/2023   MAMMOGRAM  03/27/2024   HPV VACCINES  Aged Out    COVID-19 Vaccine  Discontinued    Discussed health benefits of physical activity, and encouraged her to engage in regular exercise appropriate for her age and condition.  1. Annual physical exam Checking labs as below. UTD on preventative care. Wellness information provided with AVS. -  CBC with Differential/Platelet - COMPLETE METABOLIC PANEL WITH GFR - Lipid panel  2. Cervical cancer screening Pap smear with HPV cotesting completed today.  - Cytology - PAP  3. Encounter for screening mammogram for malignant neoplasm of breast Mammogram ordered. - MM Digital Diagnostic Bilat; Future  4. Colon cancer screening Referring to GI for colonoscopy. - Ambulatory referral to Gastroenterology  5. Vaginal irritation Stat wet prep.  She does have some significant vaginal irritation and erythema at the vaginal introitus and the inner labia minora.  Adding triamcinolone cream twice daily PRN for up to 2 weeks. - WET PREP FOR TRICH, YEAST, CLUE  6. Vertebral artery dissection (HCC) Last imaging done in 10/2021. Some increased frequency of symptoms. Updating Carotid US.  - US Carotid Bilateral; Future  7. Attention deficit disorder (ADD) in adult Previously treated with Adderall but this was stopped when the vertebral artery dissection was found.  Continues to have difficulty with completing tasks throughout the day and finds that she does a little bit of various things but never seems to finish anything.  Interested in options such as nonstimulant medications.  Previously took Wellbutrin and found this was well-tolerated.  Would like to give this a try again.  Starting Wellbutrin 150 mg daily.   Return in about 4 weeks (around 04/24/2023) for wellbutrin follow up.     Christen Butter, NP

## 2023-03-28 ENCOUNTER — Encounter: Payer: Self-pay | Admitting: Medical-Surgical

## 2023-03-28 DIAGNOSIS — B379 Candidiasis, unspecified: Secondary | ICD-10-CM

## 2023-03-28 LAB — LIPID PANEL
Cholesterol: 164 mg/dL (ref <200)
HDL: 62 mg/dL (ref >=50)
LDL Cholesterol (Calc): 84 mg/dL (calc)
Non-HDL Cholesterol (Calc): 102 mg/dL (calc) (ref <130)
Total CHOL/HDL Ratio: 2.6 (calc) (ref <5.0)
Triglycerides: 88 mg/dL (ref <150)

## 2023-03-28 LAB — COMPLETE METABOLIC PANEL WITH GFR
AG Ratio: 2 (calc) (ref 1.0–2.5)
ALT: 22 U/L (ref 6–29)
AST: 16 U/L (ref 10–35)
Albumin: 4.3 g/dL (ref 3.6–5.1)
Alkaline phosphatase (APISO): 48 U/L (ref 37–153)
BUN: 8 mg/dL (ref 7–25)
CO2: 29 mmol/L (ref 20–32)
Calcium: 9.3 mg/dL (ref 8.6–10.4)
Chloride: 104 mmol/L (ref 98–110)
Creat: 0.81 mg/dL (ref 0.50–1.03)
Globulin: 2.2 g/dL (calc) (ref 1.9–3.7)
Glucose, Bld: 83 mg/dL (ref 65–99)
Potassium: 3.9 mmol/L (ref 3.5–5.3)
Sodium: 139 mmol/L (ref 135–146)
Total Bilirubin: 0.3 mg/dL (ref 0.2–1.2)
Total Protein: 6.5 g/dL (ref 6.1–8.1)
eGFR: 88 mL/min/{1.73_m2} (ref >=60)

## 2023-03-28 LAB — WET PREP FOR TRICH, YEAST, CLUE
MICRO NUMBER:: 15001296
Specimen Quality: ADEQUATE

## 2023-03-28 LAB — CBC WITH DIFFERENTIAL/PLATELET
Absolute Monocytes: 883 cells/uL (ref 200–950)
Basophils Absolute: 49 cells/uL (ref 0–200)
Basophils Relative: 0.6 %
Eosinophils Absolute: 113 cells/uL (ref 15–500)
Eosinophils Relative: 1.4 %
HCT: 41.7 % (ref 35.0–45.0)
Hemoglobin: 14.2 g/dL (ref 11.7–15.5)
Lymphs Abs: 1944 cells/uL (ref 850–3900)
MCH: 29.5 pg (ref 27.0–33.0)
MCHC: 34.1 g/dL (ref 32.0–36.0)
MCV: 86.5 fL (ref 80.0–100.0)
MPV: 8.9 fL (ref 7.5–12.5)
Monocytes Relative: 10.9 %
Neutro Abs: 5111 cells/uL (ref 1500–7800)
Neutrophils Relative %: 63.1 %
Platelets: 418 10*3/uL — ABNORMAL HIGH (ref 140–400)
RBC: 4.82 10*6/uL (ref 3.80–5.10)
RDW: 12 % (ref 11.0–15.0)
Total Lymphocyte: 24 %
WBC: 8.1 10*3/uL (ref 3.8–10.8)

## 2023-03-28 MED ORDER — FLUCONAZOLE 150 MG PO TABS
150.0000 mg | ORAL_TABLET | Freq: Once | ORAL | 0 refills | Status: AC
Start: 2023-03-28 — End: 2023-03-28

## 2023-04-03 LAB — CYTOLOGY - PAP
Comment: NEGATIVE
Diagnosis: NEGATIVE
Diagnosis: REACTIVE
High risk HPV: NEGATIVE

## 2023-04-27 ENCOUNTER — Ambulatory Visit: Payer: Federal, State, Local not specified - PPO | Admitting: Medical-Surgical

## 2023-04-27 ENCOUNTER — Encounter: Payer: Self-pay | Admitting: Medical-Surgical

## 2023-04-27 VITALS — BP 114/73 | HR 71 | Resp 20 | Ht 68.0 in | Wt 192.0 lb

## 2023-04-27 DIAGNOSIS — F988 Other specified behavioral and emotional disorders with onset usually occurring in childhood and adolescence: Secondary | ICD-10-CM | POA: Diagnosis not present

## 2023-04-27 DIAGNOSIS — Z23 Encounter for immunization: Secondary | ICD-10-CM | POA: Diagnosis not present

## 2023-04-27 DIAGNOSIS — F32A Depression, unspecified: Secondary | ICD-10-CM | POA: Diagnosis not present

## 2023-04-27 MED ORDER — BUPROPION HCL ER (XL) 300 MG PO TB24: 300.0000 mg | ORAL_TABLET | Freq: Every day | ORAL | 1 refills | Status: DC

## 2023-04-27 NOTE — Progress Notes (Signed)
        Established patient visit  History, exam, impression, and plan:  1. Attention deficit disorder (ADD) in adult 2. Mild depression Pleasant 52 year old female presenting today for follow-up after starting Wellbutrin 4 weeks ago.  Has been taking Wellbutrin XL 150 mg daily, tolerating well.  The first week or so she had headaches and difficulty sleeping however this has fully resolved.  Not sure if this was related to the medicine or the increase stress at work that she was having.  Today notes that she feels that the medication has been helpful and she likes the benefits that she seen so far.  Is not sure if it is helping with ADHD/ADD however feels that it has helped some of her fatigue and mild depressive symptoms.  Is interested in trying the increased dose of 300 mg daily to see if this provides further benefit.  Denies SI/HI.  Mood, affect, thought pattern, and cognition normal today.  Increasing Wellbutrin to 300 mg daily.  Advised her to reach out let me know if she is seeing good benefit from this in 3 to 4 weeks and plan to follow-up in person in 6 months.  3. Need for shingles vaccine She is showing due for Shingrix vaccination.  Requesting more information regarding the vaccine series.  I did discuss the recommendation for vaccination and the Shingrix vaccine verbally.  VIS included on after visit summary for her review.  If she decides to proceed with this, okay to schedule as a nurse visit at her convenience.  Procedures performed this visit: None.  Return in about 6 months (around 10/27/2023) for mood follow up.  __________________________________ Thayer Ohm, DNP, APRN, FNP-BC Primary Care and Sports Medicine [facility]

## 2023-04-30 ENCOUNTER — Other Ambulatory Visit: Payer: Self-pay | Admitting: Medical-Surgical

## 2023-04-30 DIAGNOSIS — Z1211 Encounter for screening for malignant neoplasm of colon: Secondary | ICD-10-CM

## 2023-04-30 DIAGNOSIS — Z124 Encounter for screening for malignant neoplasm of cervix: Secondary | ICD-10-CM

## 2023-04-30 DIAGNOSIS — N6310 Unspecified lump in the right breast, unspecified quadrant: Secondary | ICD-10-CM

## 2023-04-30 DIAGNOSIS — N898 Other specified noninflammatory disorders of vagina: Secondary | ICD-10-CM

## 2023-04-30 DIAGNOSIS — I7774 Dissection of vertebral artery: Secondary | ICD-10-CM

## 2023-04-30 DIAGNOSIS — Z0000 Encounter for general adult medical examination without abnormal findings: Secondary | ICD-10-CM

## 2023-04-30 DIAGNOSIS — F988 Other specified behavioral and emotional disorders with onset usually occurring in childhood and adolescence: Secondary | ICD-10-CM

## 2023-05-08 ENCOUNTER — Ambulatory Visit
Admission: RE | Admit: 2023-05-08 | Discharge: 2023-05-08 | Disposition: A | Discharge status: Home / Self Care | Payer: Federal, State, Local not specified - PPO | Source: Ambulatory Visit | Attending: Medical-Surgical | Admitting: Medical-Surgical

## 2023-05-08 DIAGNOSIS — R928 Other abnormal and inconclusive findings on diagnostic imaging of breast: Secondary | ICD-10-CM | POA: Diagnosis not present

## 2023-05-08 DIAGNOSIS — N6315 Unspecified lump in the right breast, overlapping quadrants: Secondary | ICD-10-CM | POA: Diagnosis not present

## 2023-05-08 DIAGNOSIS — N6310 Unspecified lump in the right breast, unspecified quadrant: Secondary | ICD-10-CM

## 2023-05-12 ENCOUNTER — Ambulatory Visit (INDEPENDENT_AMBULATORY_CARE_PROVIDER_SITE_OTHER): Payer: Federal, State, Local not specified - PPO

## 2023-05-12 DIAGNOSIS — I7779 Dissection of other artery: Secondary | ICD-10-CM | POA: Diagnosis not present

## 2023-05-12 DIAGNOSIS — I6523 Occlusion and stenosis of bilateral carotid arteries: Secondary | ICD-10-CM | POA: Diagnosis not present

## 2023-05-12 DIAGNOSIS — I7774 Dissection of vertebral artery: Secondary | ICD-10-CM | POA: Diagnosis not present

## 2023-05-12 DIAGNOSIS — Z87891 Personal history of nicotine dependence: Secondary | ICD-10-CM | POA: Diagnosis not present

## 2023-05-13 ENCOUNTER — Encounter: Payer: Self-pay | Admitting: Medical-Surgical

## 2023-05-19 ENCOUNTER — Ambulatory Visit (AMBULATORY_SURGERY_CENTER): Payer: Federal, State, Local not specified - PPO

## 2023-05-19 VITALS — Ht 68.0 in | Wt 192.0 lb

## 2023-05-19 DIAGNOSIS — Z1211 Encounter for screening for malignant neoplasm of colon: Secondary | ICD-10-CM

## 2023-05-19 NOTE — Progress Notes (Signed)
No egg or soy allergy known to patient  No issues known to pt with past sedation with any surgeries or procedures Patient denies ever being told they had issues or difficulty with intubation  No FH of Malignant Hyperthermia Pt is not on diet pills Pt is not on  home 02  Pt is not on blood thinners  Pt denies issues with constipation  No A fib or A flutter Have any cardiac testing pending--no LOA: independent  Prep: miralax   Patient's chart reviewed by Cathlyn Parsons CNRA prior to previsit and patient appropriate for the [facility].  Previsit completed and red dot placed by patient's name on their procedure day (on provider's schedule).     PV competed with patient. Prep instructions sent via mychart and home address

## 2023-05-24 ENCOUNTER — Encounter: Payer: Self-pay | Admitting: Certified Registered Nurse Anesthetist

## 2023-05-27 ENCOUNTER — Encounter: Payer: Self-pay | Admitting: Internal Medicine

## 2023-05-27 ENCOUNTER — Ambulatory Visit (AMBULATORY_SURGERY_CENTER): Payer: Federal, State, Local not specified - PPO | Admitting: Internal Medicine

## 2023-05-27 VITALS — BP 112/71 | HR 78 | Temp 98.4°F | Resp 17 | Ht 68.0 in | Wt 189.0 lb

## 2023-05-27 DIAGNOSIS — Z1211 Encounter for screening for malignant neoplasm of colon: Secondary | ICD-10-CM

## 2023-05-27 MED ORDER — SODIUM CHLORIDE 0.9 % IV SOLN: 500.0000 mL | Freq: Once | INTRAVENOUS | Status: DC

## 2023-05-27 NOTE — Progress Notes (Signed)
[facility] History and Physical   Primary Care Physician:  Christen Butter, NP   Reason for Procedure:   [facility]CA screen  Plan:    colonoscopy     HPI: Shelly Walton is a 52 y.o. female here for screening exam   Past Medical History:  Diagnosis Date   Anxiety    Depression    Thyroid disease    Vertebral artery dissection [facility])     Past Surgical History:  Procedure Laterality Date   CESAREAN SECTION      Prior to Admission medications   Medication Sig Start Date End Date Taking? Authorizing Provider  Aspirin 81 MG CAPS Take 81 mg by mouth daily.   Yes [provider]  atorvastatin (LIPITOR) 10 MG tablet Take 1 tablet (10 mg total) by mouth daily. 03/27/23  Yes Christen Butter, NP  buPROPion (WELLBUTRIN XL) 300 MG 24 hr tablet Take 1 tablet (300 mg total) by mouth daily. 04/27/23  Yes Christen Butter, NP  Ferrous Sulfate (IRON PO) Take 45 mg by mouth every other day.   Yes [provider]  Magnesium Glycinate 120 MG CAPS Take 150 mg by mouth daily.   Yes [provider]    Current Outpatient Medications  Medication Sig Dispense Refill   Aspirin 81 MG CAPS Take 81 mg by mouth daily.     atorvastatin (LIPITOR) 10 MG tablet Take 1 tablet (10 mg total) by mouth daily. 90 tablet 0   buPROPion (WELLBUTRIN XL) 300 MG 24 hr tablet Take 1 tablet (300 mg total) by mouth daily. 90 tablet 1   Ferrous Sulfate (IRON PO) Take 45 mg by mouth every other day.     Magnesium Glycinate 120 MG CAPS Take 150 mg by mouth daily.     Current Facility-Administered Medications  Medication Dose Route Frequency Provider Last Rate Last Admin   0.9 %  sodium chloride infusion  500 mL Intravenous Once Iva Boop, MD        Allergies as of 05/27/2023   (No Known Allergies)    Family History  Problem Relation Age of Onset   Breast cancer Mother    Cancer Mother    Stroke Mother    Colon cancer Neg Hx    Colon polyps Neg Hx    Esophageal cancer Neg Hx     Rectal cancer Neg Hx    Stomach cancer Neg Hx     Social History   Socioeconomic History   Marital status: Married    Spouse name: Not on file   Number of children: 2   Years of education: Not on file   Highest education level: Not on file  Occupational History   Not on file  Tobacco Use   Smoking status: Former    Current packs/day: 0.00    Average packs/day: 1 pack/day for 8.0 years (8.0 ttl pk-yrs)    Types: Cigarettes    Start date: 11/04/2011    Quit date: 11/04/2019    Years since quitting: 3.5   Smokeless tobacco: Never  Vaping Use   Vaping status: Never Used  Substance and Sexual Activity   Alcohol use: Yes    Alcohol/week: 2.0 standard drinks of alcohol    Types: 2 Standard drinks or equivalent per week   Drug use: Not Currently   Sexual activity: Yes    Birth control/protection: Surgical  Other Topics Concern   Not on file  Social History Narrative   Not on file   Social  Determinants of Health   Financial Resource Strain: Not on file  Food Insecurity: No Food Insecurity (09/11/2021)   Received from [facility]   Hunger Vital Sign    Worried About Running Out of Food in the Last Year: Never true    Ran Out of Food in the Last Year: Never true  Transportation Needs: Not on file  Physical Activity: Not on file  Stress: Not on file  Social Connections: Unknown (03/18/2022)   Received from [facility]   Social Network    Social Network: Not on file  Intimate Partner Violence: Unknown (02/07/2022)   Received from [facility]   HITS    Physically Hurt: Not on file    Insult or Talk Down To: Not on file    Threaten Physical Harm: Not on file    Scream or Curse: Not on file    Review of Systems:  All other review of systems negative except as mentioned in the HPI.  Physical Exam: Vital signs BP (!) 141/87 (BP Location: Right Arm, Patient Position: Sitting, Cuff Size: Normal)   Pulse 76   Temp 98.4 F  (36.9 C) (Temporal)   Ht 5\' 8"  (1.727 m)   Wt 189 lb (85.7 kg)   LMP 05/21/2023   SpO2 99%   BMI 28.74 kg/m   General:   Alert,  Well-developed, well-nourished, pleasant and cooperative in NAD Lungs:  Clear throughout to auscultation.   Heart:  Regular rate and rhythm; no murmurs, clicks, rubs,  or gallops. Abdomen:  Soft, nontender and nondistended. Normal bowel sounds.   Neuro/Psych:  Alert and cooperative. Normal mood and affect. A and O x 3   @Prudence Heiny  Sena Slate, MD, [facility] [phone] (pager) 05/27/2023 9:27 AM@

## 2023-05-27 NOTE — Progress Notes (Signed)
Vitals-CW  Pt's states no medical or surgical changes since previsit or office visit. 

## 2023-05-27 NOTE — Patient Instructions (Addendum)
No polyps or cancer were seen.  You do have diverticulosis - thickened muscle rings and pouches in the colon wall. Please read the handout about this condition.  Next routine colonoscopy or other screening test in 10 years - 2034.  - Resume previous diet. - Continue present medications. - Repeat colonoscopy in 10 years for screening purposes.  I appreciate the opportunity to care for you. Iva Boop, MD, FACG   YOU HAD AN ENDOSCOPIC PROCEDURE TODAY AT THE [facility]:   Refer to the procedure report that was given to you for any specific questions about what was found during the examination.  If the procedure report does not answer your questions, please call your gastroenterologist to clarify.  If you requested that your care partner not be given the details of your procedure findings, then the procedure report has been included in a sealed envelope for you to review at your convenience later.  YOU SHOULD EXPECT: Some feelings of bloating in the abdomen. Passage of more gas than usual.  Walking can help get rid of the air that was put into your GI tract during the procedure and reduce the bloating. If you had a lower endoscopy (such as a colonoscopy or flexible sigmoidoscopy) you may notice spotting of blood in your stool or on the toilet paper. If you underwent a bowel prep for your procedure, you may not have a normal bowel movement for a few days.  Please Note:  You might notice some irritation and congestion in your nose or some drainage.  This is from the oxygen used during your procedure.  There is no need for concern and it should clear up in a day or so.  SYMPTOMS TO REPORT IMMEDIATELY:  Following lower endoscopy (colonoscopy or flexible sigmoidoscopy):  Excessive amounts of blood in the stool  Significant tenderness or worsening of abdominal pains  Swelling of the abdomen that is new, acute  Fever of 100F or higher  For urgent or emergent issues, a  gastroenterologist can be reached at any hour by calling [phone]. Do not use MyChart messaging for urgent concerns.    DIET:  We do recommend a small meal at first, but then you may proceed to your regular diet.  Drink plenty of fluids but you should avoid alcoholic beverages for 24 hours.  ACTIVITY:  You should plan to take it easy for the rest of today and you should NOT DRIVE or use heavy machinery until tomorrow (because of the sedation medicines used during the test).    FOLLOW UP: Our staff will call the number listed on your records the next business day following your procedure.  We will call around 7:15- 8:00 am to check on you and address any questions or concerns that you may have regarding the information given to you following your procedure. If we do not reach you, we will leave a message.     If any biopsies were taken you will be contacted by phone or by letter within the next 1-3 weeks.  Please call us at [phone] if you have not heard about the biopsies in 3 weeks.    SIGNATURES/CONFIDENTIALITY: You and/or your care partner have signed paperwork which will be entered into your electronic medical record.  These signatures attest to the fact that that the information above on your After Visit Summary has been reviewed and is understood.  Full responsibility of the confidentiality of this discharge information lies with you and/or your care-partner.

## 2023-05-27 NOTE — Progress Notes (Signed)
Report given to PACU, vss 

## 2023-05-27 NOTE — Op Note (Signed)
[facility] Patient Name: Shelly Walton Procedure Date: 05/27/2023 9:26 AM MRN: [id number] Endoscopist: Iva Boop , MD, [id number] Age: 52 Referring MD:  Date of Birth: 12-21-1970 Gender: Female Account #: [id number] Procedure:                Colonoscopy Indications:              Screening for colorectal malignant neoplasm, This                            is the patient's first colonoscopy Medicines:                Monitored Anesthesia Care Procedure:                Pre-Anesthesia Assessment:                           - Prior to the procedure, a History and Physical                            was performed, and patient medications and                            allergies were reviewed. The patient's tolerance of                            previous anesthesia was also reviewed. The risks                            and benefits of the procedure and the sedation                            options and risks were discussed with the patient.                            All questions were answered, and informed consent                            was obtained. Prior Anticoagulants: The patient has                            taken no anticoagulant or antiplatelet agents. ASA                            Grade Assessment: III - A patient with severe                            systemic disease. After reviewing the risks and                            benefits, the patient was deemed in satisfactory                            condition to undergo the procedure.  After obtaining informed consent, the colonoscope                            was passed under direct vision. Throughout the                            procedure, the patient's blood pressure, pulse, and                            oxygen saturations were monitored continuously. The                            Olympus Scope SN: [id number] was introduced through                            the anus and  advanced to the the cecum, identified                            by appendiceal orifice and ileocecal valve. The                            colonoscopy was performed without difficulty. The                            patient tolerated the procedure well. The quality                            of the bowel preparation was good. The ileocecal                            valve, appendiceal orifice, and rectum were                            photographed. The bowel preparation used was                            Miralax via split dose instruction. Scope In: 9:35:48 AM Scope Out: 9:47:32 AM Scope Withdrawal Time: 0 hours 8 minutes 58 seconds  Total Procedure Duration: 0 hours 11 minutes 44 seconds  Findings:                 The perianal and digital rectal examinations were                            normal.                           Multiple diverticula were found in the sigmoid                            colon.                           The exam was otherwise without abnormality on  direct and retroflexion views. Complications:            No immediate complications. Estimated Blood Loss:     Estimated blood loss: none. Impression:               - Diverticulosis in the sigmoid colon.                           - The examination was otherwise normal on direct                            and retroflexion views.                           - No specimens collected. Recommendation:           - Patient has a contact number available for                            emergencies. The signs and symptoms of potential                            delayed complications were discussed with the                            patient. Return to normal activities tomorrow.                            Written discharge instructions were provided to the                            patient.                           - Resume previous diet.                           - Continue present  medications.                           - Repeat colonoscopy in 10 years for screening                            purposes. Iva Boop, MD 05/27/2023 9:55:41 AM This report has been signed electronically.

## 2023-05-28 ENCOUNTER — Telehealth: Payer: Self-pay | Admitting: *Deleted

## 2023-05-28 NOTE — Telephone Encounter (Signed)
  Follow up Call-     05/27/2023    8:15 AM 05/27/2023    8:13 AM  Call back number  Post procedure Call Back phone  # [phone]   Permission to leave phone message  Yes     Patient questions:  Do you have a fever, pain , or abdominal swelling? No. Pain Score  0 *  Have you tolerated food without any problems? Yes.    Have you been able to return to your normal activities? Yes.    Do you have any questions about your discharge instructions: Diet   No. Medications  No. Follow up visit  No.  Do you have questions or concerns about your Care? No.  Actions: * If pain score is 4 or above: No action needed, pain <4.

## 2023-06-21 ENCOUNTER — Other Ambulatory Visit: Payer: Self-pay | Admitting: Medical-Surgical

## 2023-07-27 IMAGING — MG MM DIGITAL DIAGNOSTIC UNILAT*R* W/ TOMO W/ CAD
4 series · 4 of 12 positions shown · non-contrast
Comparison: Previous exam(s).

CLINICAL DATA: The patient was called back for a right breast
asymmetry

EXAM:
DIGITAL DIAGNOSTIC UNILATERAL RIGHT MAMMOGRAM WITH TOMOSYNTHESIS AND
CAD; ULTRASOUND RIGHT BREAST LIMITED
TECHNIQUE: Right digital diagnostic mammography and breast tomosynthesis was
performed. The images were evaluated with computer-aided detection.;
Targeted ultrasound examination of the right breast was performed

[R ML synth-2D]
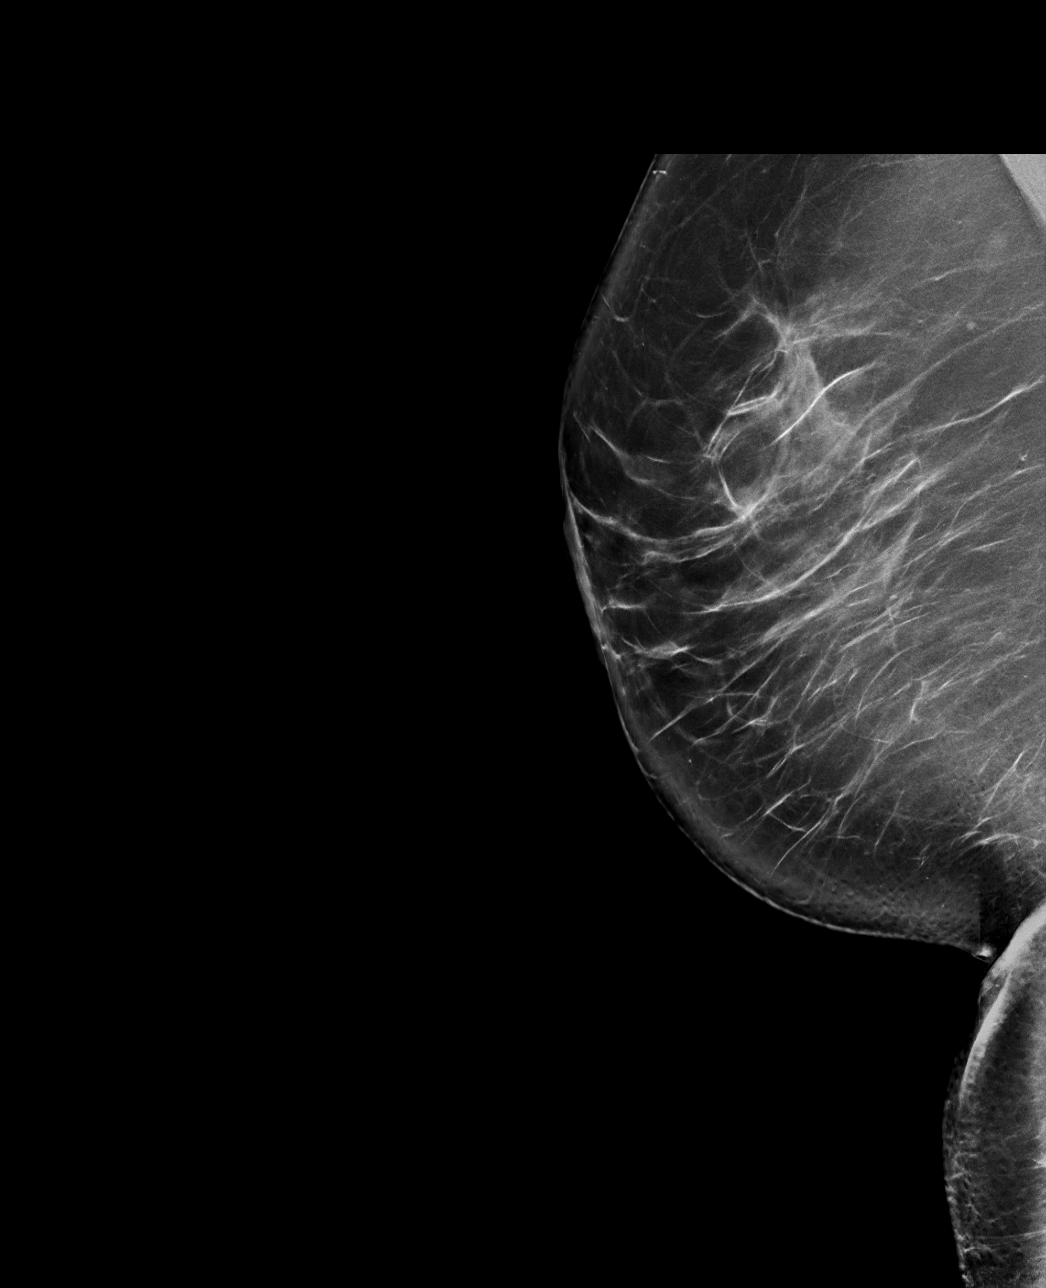

[R MLO synth-2D]
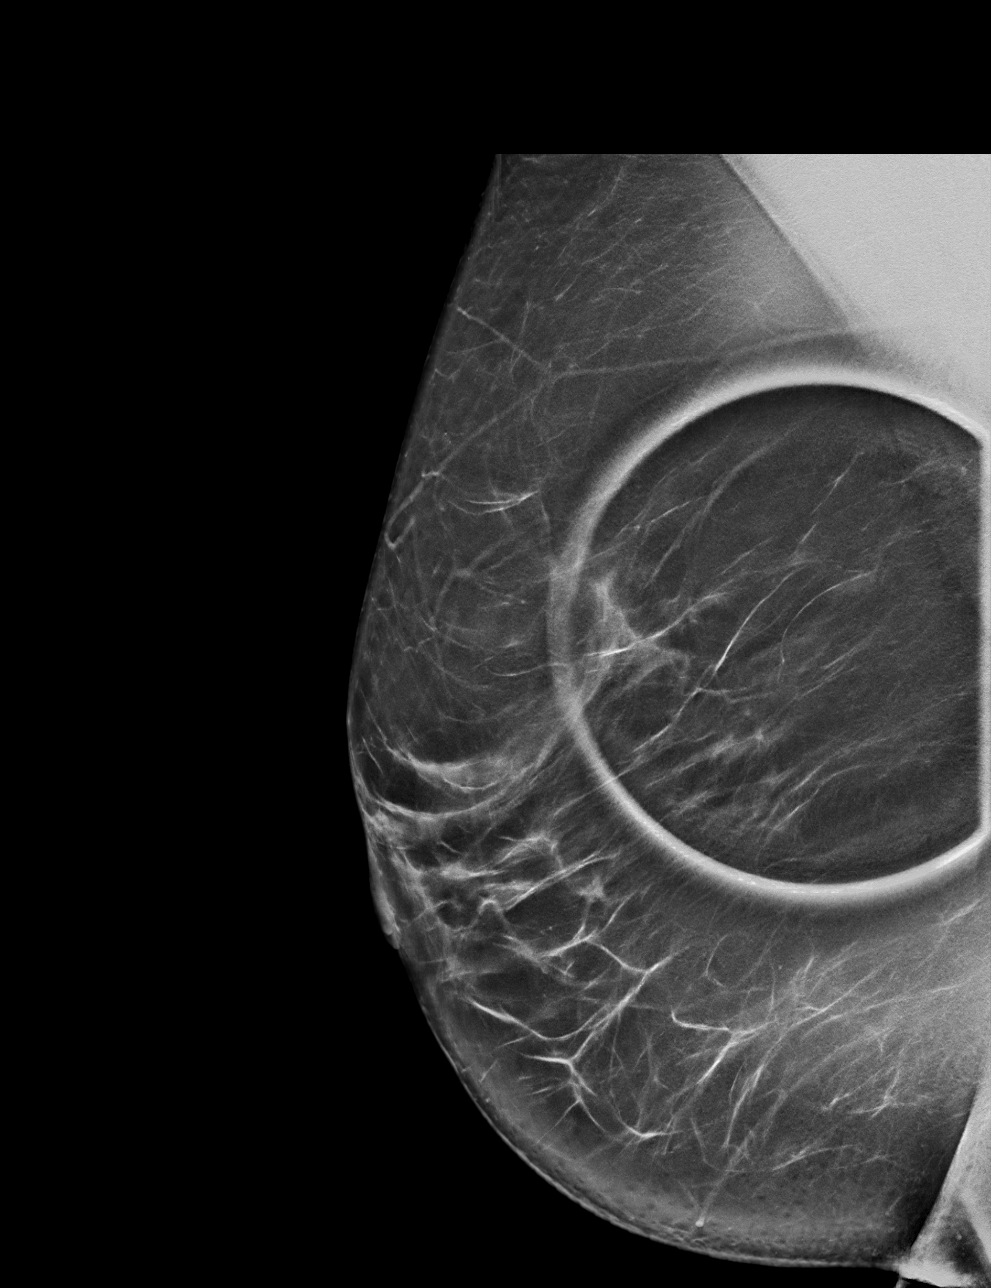

[R MLO tomo · tomo slice 46/91.0]
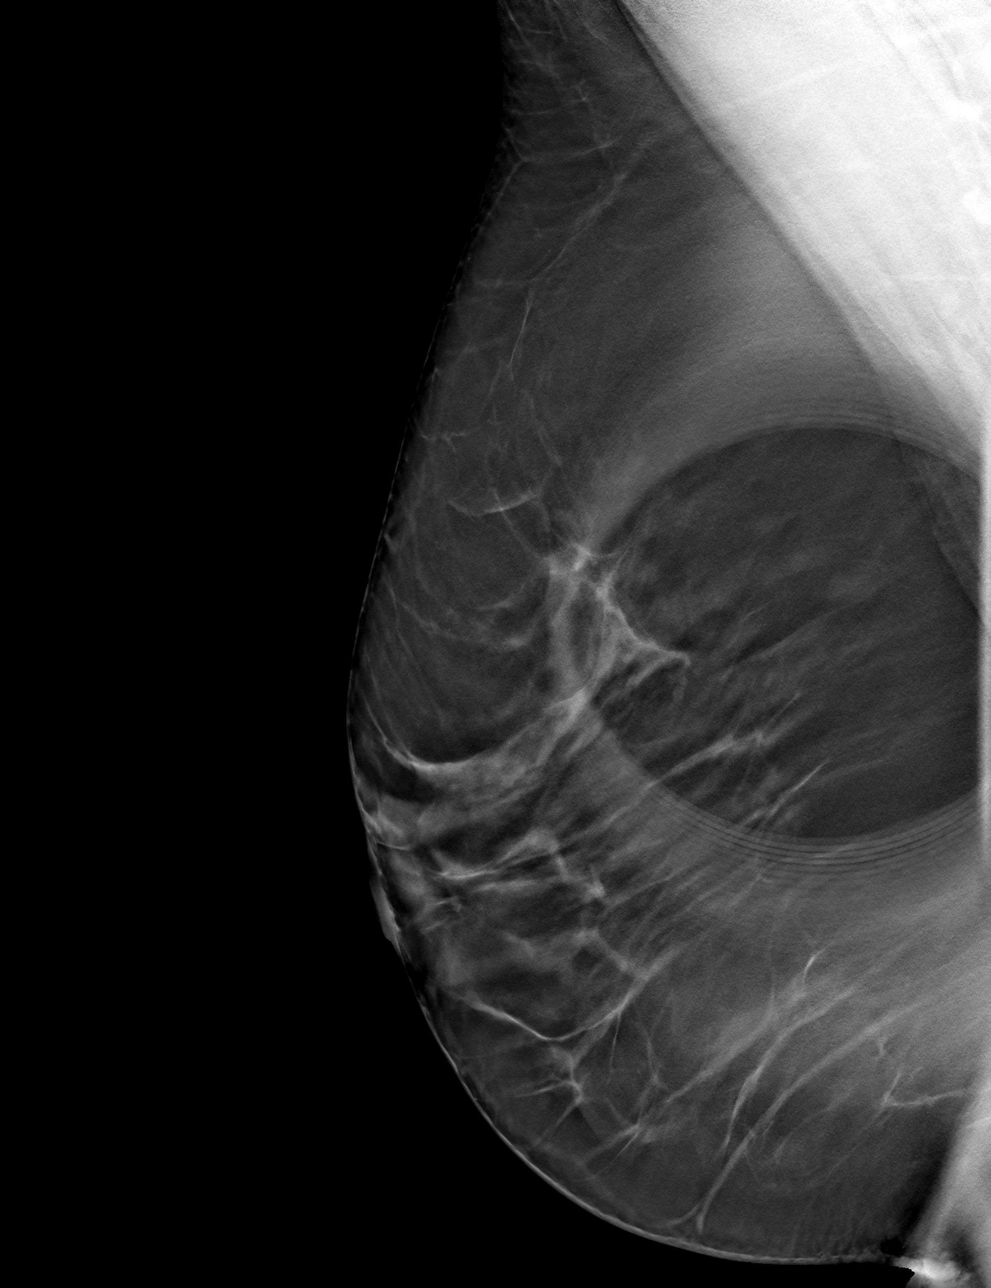

[R ML tomo · tomo slice 49/98.0]
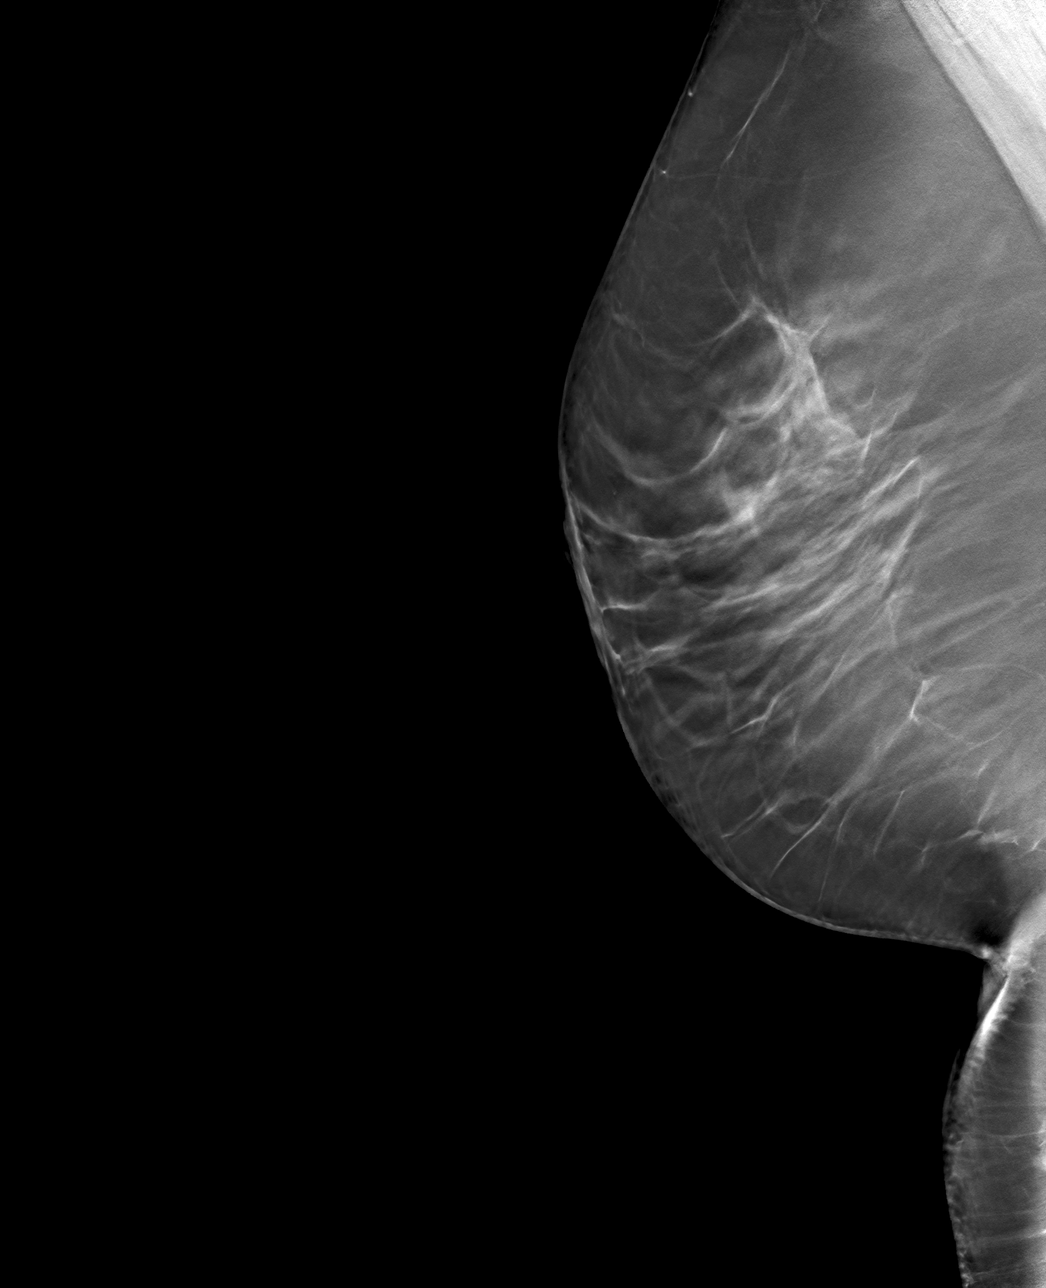

[4 of 12 positions shown; findings below may reference images not displayed]

ACR Breast Density Category b: There are scattered areas of
fibroglandular density.
FINDINGS: The right breast asymmetry persists on today's imaging. There
appears to be interspersed fat. This region was not pulled into view
on the 5858 comparison.

Targeted ultrasound is performed, showing no sonographic correlate
for the right breast asymmetry.
IMPRESSION: The right breast asymmetry is favored to represent an intramammary
lymph node and considered probably benign.

RECOMMENDATION:
Recommend six-month follow-up mammogram of the probably benign right
breast mass, likely an intramammary lymph node.

I have discussed the findings and recommendations with the patient.
If applicable, a reminder letter will be sent to the patient
regarding the next appointment.

BI-RADS CATEGORY  3: Probably benign.

## 2023-07-27 IMAGING — US US BREAST*R* LIMITED INC AXILLA
1 series · 5 of 5 positions shown · non-contrast
Comparison: Previous exam(s).

CLINICAL DATA: The patient was called back for a right breast
asymmetry

EXAM:
DIGITAL DIAGNOSTIC UNILATERAL RIGHT MAMMOGRAM WITH TOMOSYNTHESIS AND
CAD; ULTRASOUND RIGHT BREAST LIMITED
TECHNIQUE: Right digital diagnostic mammography and breast tomosynthesis was
performed. The images were evaluated with computer-aided detection.;
Targeted ultrasound examination of the right breast was performed

[Series 1: us breast*right* limited inc axilla · 0.07mm/px · 5 of 5 slices shown]
[im 1/5]
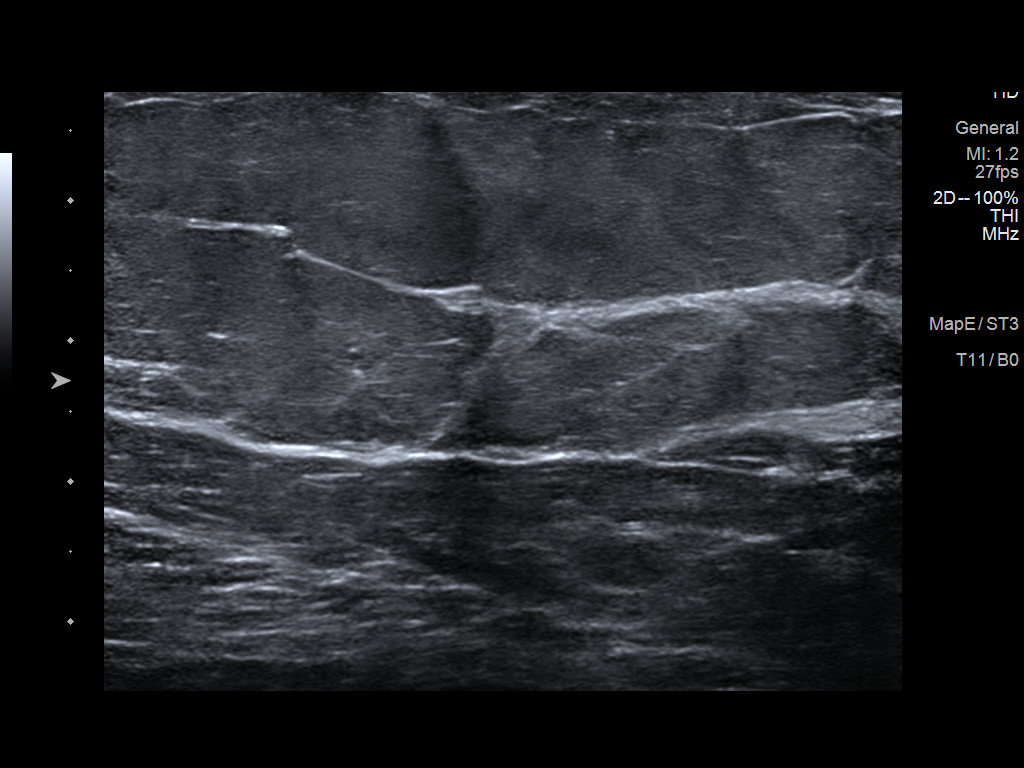
[im 2/5]
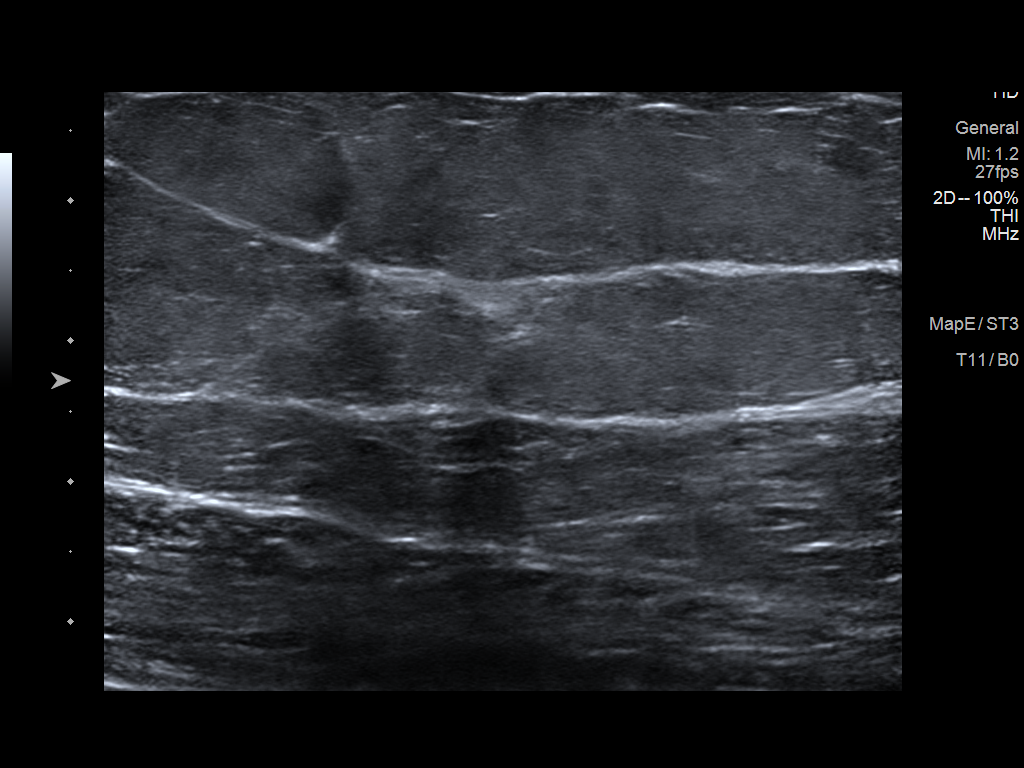
[im 3/5]
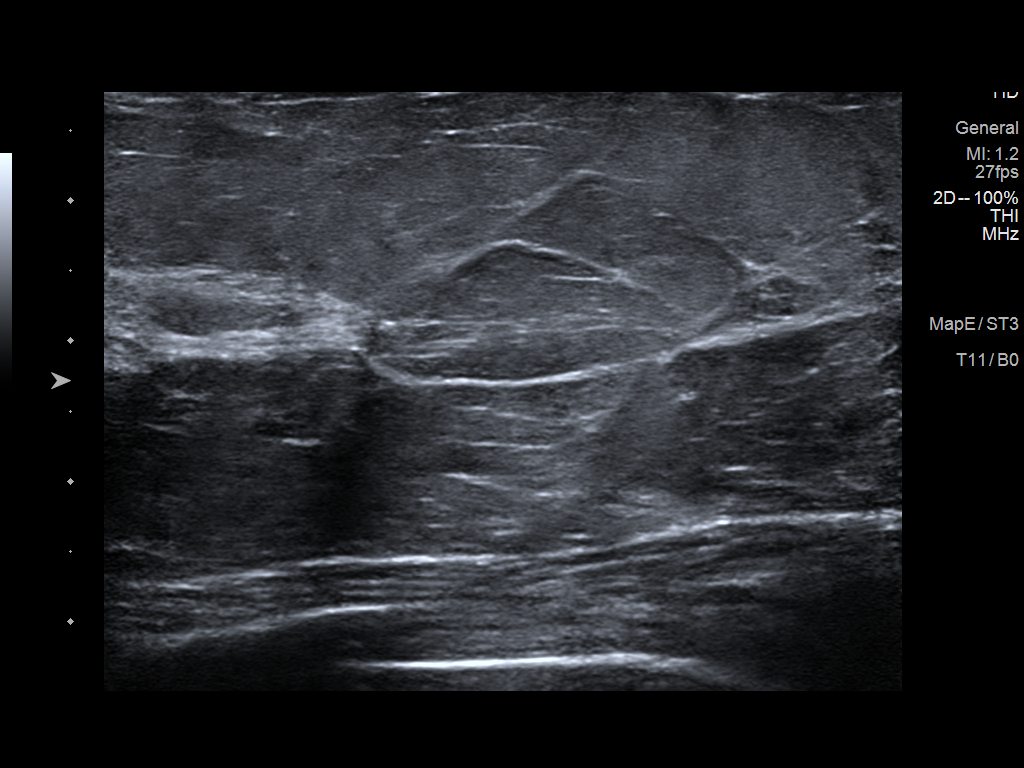
[im 4/5]
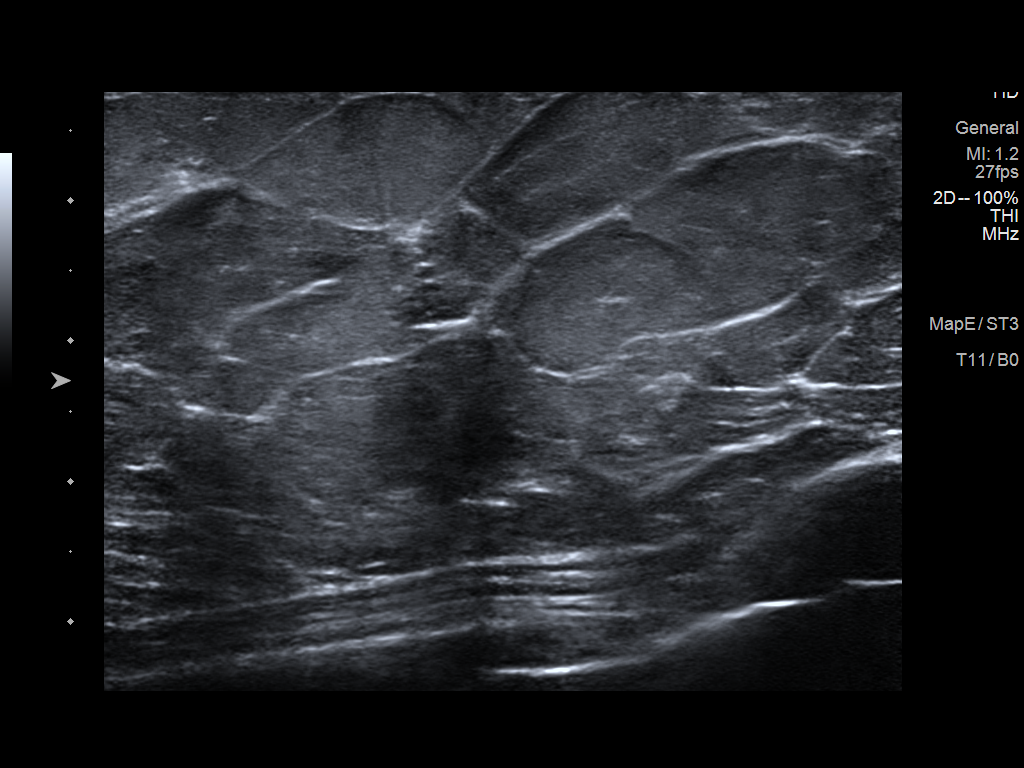
[im 5/5]
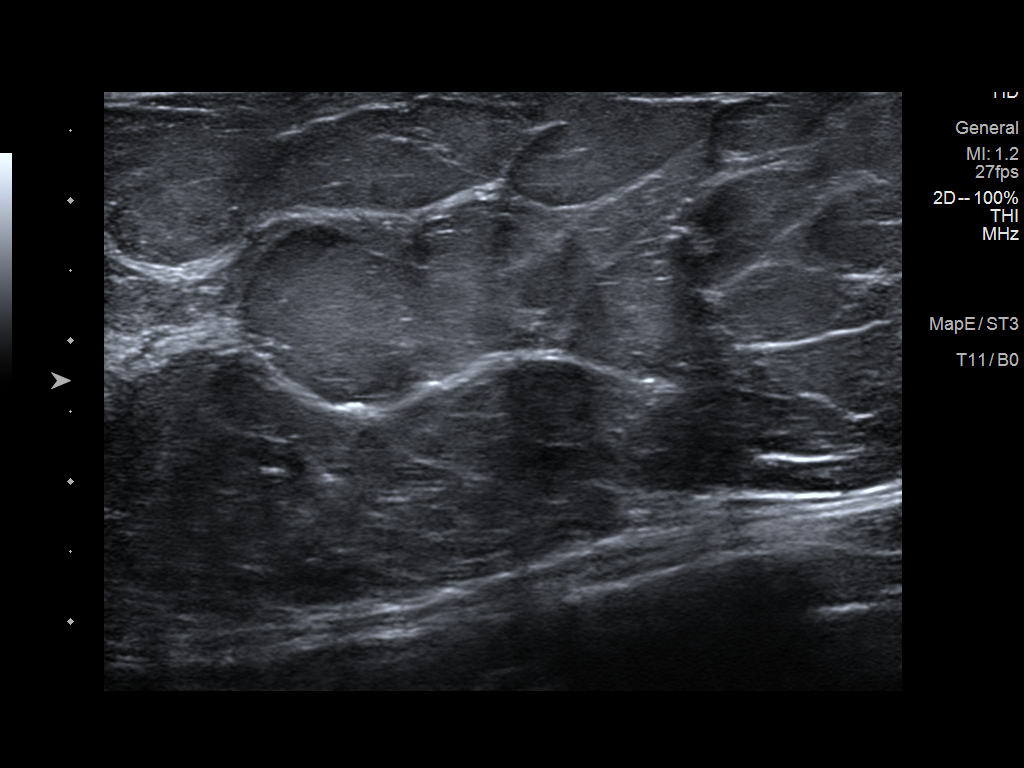

[5 of 5 positions shown; findings below may reference images not displayed]

ACR Breast Density Category b: There are scattered areas of
fibroglandular density.
FINDINGS: The right breast asymmetry persists on today's imaging. There
appears to be interspersed fat. This region was not pulled into view
on the 5858 comparison.

Targeted ultrasound is performed, showing no sonographic correlate
for the right breast asymmetry.
IMPRESSION: The right breast asymmetry is favored to represent an intramammary
lymph node and considered probably benign.

RECOMMENDATION:
Recommend six-month follow-up mammogram of the probably benign right
breast mass, likely an intramammary lymph node.

I have discussed the findings and recommendations with the patient.
If applicable, a reminder letter will be sent to the patient
regarding the next appointment.

BI-RADS CATEGORY  3: Probably benign.

## 2023-09-22 ENCOUNTER — Other Ambulatory Visit: Payer: Self-pay | Admitting: Medical-Surgical

## 2023-10-23 ENCOUNTER — Ambulatory Visit: Payer: Federal, State, Local not specified - PPO | Admitting: Medical-Surgical

## 2023-10-29 ENCOUNTER — Other Ambulatory Visit: Payer: Self-pay | Admitting: Medical-Surgical

## 2023-11-03 ENCOUNTER — Ambulatory Visit (INDEPENDENT_AMBULATORY_CARE_PROVIDER_SITE_OTHER): Payer: Federal, State, Local not specified - PPO | Admitting: Medical-Surgical

## 2023-11-03 ENCOUNTER — Encounter: Payer: Self-pay | Admitting: Medical-Surgical

## 2023-11-03 VITALS — BP 114/81 | HR 87 | Resp 20 | Ht 68.0 in | Wt 195.8 lb

## 2023-11-03 DIAGNOSIS — Z5181 Encounter for therapeutic drug level monitoring: Secondary | ICD-10-CM

## 2023-11-03 DIAGNOSIS — Z79899 Other long term (current) drug therapy: Secondary | ICD-10-CM

## 2023-11-03 DIAGNOSIS — F32A Depression, unspecified: Secondary | ICD-10-CM | POA: Diagnosis not present

## 2023-11-03 DIAGNOSIS — F988 Other specified behavioral and emotional disorders with onset usually occurring in childhood and adolescence: Secondary | ICD-10-CM | POA: Diagnosis not present

## 2023-11-03 MED ORDER — ATORVASTATIN CALCIUM 10 MG PO TABS: 10.0000 mg | ORAL_TABLET | Freq: Every day | ORAL | 3 refills | Status: DC

## 2023-11-03 MED ORDER — BUPROPION HCL ER (XL) 300 MG PO TB24: 300.0000 mg | ORAL_TABLET | Freq: Every day | ORAL | 1 refills | Status: DC

## 2023-11-03 NOTE — Progress Notes (Signed)
        Established patient visit  History, exam, impression, and plan:  1. Attention deficit disorder (ADD) in adult (Primary) 2. Mild depression Pleasant 52 year old female presenting today for follow-up on ADD and mild depression.  She is taking Wellbutrin  300 mg daily, tolerating well without side effects.  Feels that the medication is working well for her and is enabling her to function on a regular basis.  Does not feel the medication helps her focus however she is now able to handle the overwhelming moments a lot better than before so she can work her way through them.  Denies SI/HI.  Reports that she is doing fairly well on sleep although she is waking for an unknown reason around 3 AM each morning.  Thinks she may be having hot flashes which causes the awakenings.  Overall, symptoms are well-controlled.  Continue Wellbutrin  300 mg daily.  3. Encounter for monitoring statin therapy Taking Lipitor 10 mg daily, tolerating well without side effects.  Up-to-date on lipid checks.  Continue Lipitor as prescribed.  Refills sent.  Procedures performed this visit: None.  Return in about 6 months (around 05/02/2024) for annual physical exam or sooner if needed.  __________________________________ Zada FREDRIK Palin, DNP, APRN, FNP-BC Primary Care and Sports Medicine [facility] [place]

## 2023-11-26 ENCOUNTER — Other Ambulatory Visit: Payer: Self-pay | Admitting: Medical-Surgical

## 2023-11-26 NOTE — Telephone Encounter (Signed)
1 year supply sent on 11/03/2023 with receipt confirmed by the pharmacy.

## 2024-03-23 ENCOUNTER — Telehealth: Admitting: Nurse Practitioner

## 2024-03-23 DIAGNOSIS — K219 Gastro-esophageal reflux disease without esophagitis: Secondary | ICD-10-CM

## 2024-03-23 MED ORDER — OMEPRAZOLE 20 MG PO CPDR: 20.0000 mg | DELAYED_RELEASE_CAPSULE | Freq: Every day | ORAL | 3 refills | Status: DC

## 2024-03-23 MED ORDER — FAMOTIDINE 20 MG PO TABS: ORAL_TABLET | ORAL | 1 refills | Status: DC

## 2024-03-23 NOTE — Progress Notes (Signed)
 Virtual Visit Consent   Shelly Walton, you are scheduled for a virtual visit with a [facility] provider today. Just as with appointments in the office, your consent must be obtained to participate. Your consent will be active for this visit and any virtual visit you may have with one of our providers in the next 365 days. If you have a MyChart account, a copy of this consent can be sent to you electronically.  As this is a virtual visit, video technology does not allow for your provider to perform a traditional examination. This may limit your provider's ability to fully assess your condition. If your provider identifies any concerns that need to be evaluated in person or the need to arrange testing (such as labs, EKG, etc.), we will make arrangements to do so. Although advances in technology are sophisticated, we cannot ensure that it will always work on either your end or our end. If the connection with a video visit is poor, the visit may have to be switched to a telephone visit. With either a video or telephone visit, we are not always able to ensure that we have a secure connection.  By engaging in this virtual visit, you consent to the provision of healthcare and authorize for your insurance to be billed (if applicable) for the services provided during this visit. Depending on your insurance coverage, you may receive a charge related to this service.  I need to obtain your verbal consent now. Are you willing to proceed with your visit today? Shelly Walton has provided verbal consent on 03/23/2024 for a virtual visit (video or telephone). Mardene Shake, FNP  Date: 03/23/2024 4:17 PM   Virtual Visit via Video Note   I, Mardene Shake, connected with  Shelly Walton  ([id number], 11-Apr-1971) on 03/23/24 at  4:30 PM EDT by a video-enabled telemedicine application and verified that I am speaking with the correct person using two identifiers.  Location: Patient: Virtual Visit Location  Patient: Home Provider: Virtual Visit Location Provider: Home Office   I discussed the limitations of evaluation and management by telemedicine and the availability of in person appointments. The patient expressed understanding and agreed to proceed.    History of Present Illness: Shelly Walton is a 53 y.o. who identifies as a female who was assigned female at birth, and is being seen today for a cough that has been present for the past 6 weeks.   Denies scratchy throat, Denies recent illness  Cough is intermittent though she does feel it might be more present after she eats    She has not taken any medicine for relief  Cough is dry  Denies tightness or wheezing with cough   She does admit she that she has been under more stress recently at work and that symptom onset align with stress increase. She has also had a difficult time sleeping. She can fall asleep but will wake up in the middle of the night and have a hard time returning to sleep.   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/2021   Migraine 08/20/2020    Allergies: No Known Allergies Medications:  Current Outpatient Medications:    Aspirin 81 MG CAPS, Take 81 mg by mouth daily., Disp: , Rfl:    atorvastatin  (LIPITOR) 10 MG tablet, Take 1 tablet (10 mg total) by mouth daily., Disp: 90 tablet, Rfl: 3   buPROPion  (WELLBUTRIN  XL) 300 MG 24 hr tablet, Take 1 tablet (300 mg total) by mouth daily., Disp: 90 tablet, Rfl: 1   Ferrous Sulfate  (IRON PO), Take 45 mg by mouth every other day., Disp: , Rfl:    Magnesium Glycinate 120 MG CAPS, Take 150 mg by mouth daily., Disp: , Rfl:   Observations/Objective: Patient is well-developed, well-nourished in no acute distress.  Resting comfortably  at home.  Head is normocephalic, atraumatic.  No labored breathing.  Speech is clear and coherent with logical content.  Patient is alert and oriented at baseline.    Assessment and Plan:  1. Gastroesophageal reflux disease without esophagitis  Discussed stress relieving methods, recommended seeking counseling as well to work through stressful life event   Discussed follow up recommendations if symptoms do not improve or with new/worsening symptoms  Meds ordered this encounter  Medications   famotidine (PEPCID) 20 MG tablet    Sig: Take 1 tablet twice daily for 7 days, then continue to use as needed twice daily    Dispense:  60 tablet    Refill:  1   omeprazole (PRILOSEC) 20 MG capsule    Sig: Take 1 capsule (20 mg total) by mouth daily.    Dispense:  30 capsule    Refill:  3     Follow Up Instructions: I discussed the assessment and treatment plan with the patient. The patient was provided an opportunity to ask questions and all were answered. The patient agreed with the plan and demonstrated an understanding of the instructions.  A copy of instructions were sent to the patient via MyChart unless otherwise noted below.    The patient was advised to call back or seek an in-person evaluation if the symptoms worsen or if the condition fails to improve as anticipated.    Mardene Shake, FNP

## 2024-05-03 ENCOUNTER — Other Ambulatory Visit: Payer: Self-pay | Admitting: Medical-Surgical

## 2024-05-03 ENCOUNTER — Encounter: Payer: Federal, State, Local not specified - PPO | Admitting: Medical-Surgical

## 2024-05-17 ENCOUNTER — Other Ambulatory Visit: Payer: Self-pay | Admitting: Medical-Surgical

## 2024-05-17 DIAGNOSIS — Z1231 Encounter for screening mammogram for malignant neoplasm of breast: Secondary | ICD-10-CM

## 2024-05-23 ENCOUNTER — Other Ambulatory Visit: Payer: Self-pay | Admitting: Medical-Surgical

## 2024-05-23 DIAGNOSIS — N63 Unspecified lump in unspecified breast: Secondary | ICD-10-CM

## 2024-05-25 ENCOUNTER — Ambulatory Visit: Payer: Self-pay | Admitting: Medical-Surgical

## 2024-05-25 ENCOUNTER — Ambulatory Visit
Admission: RE | Admit: 2024-05-25 | Discharge: 2024-05-25 | Disposition: A | Discharge status: Home / Self Care | Source: Ambulatory Visit | Attending: Medical-Surgical

## 2024-05-25 DIAGNOSIS — N63 Unspecified lump in unspecified breast: Secondary | ICD-10-CM

## 2024-05-25 DIAGNOSIS — R928 Other abnormal and inconclusive findings on diagnostic imaging of breast: Secondary | ICD-10-CM | POA: Diagnosis not present

## 2024-05-26 ENCOUNTER — Encounter: Payer: Self-pay | Admitting: Medical-Surgical

## 2024-05-26 ENCOUNTER — Ambulatory Visit (INDEPENDENT_AMBULATORY_CARE_PROVIDER_SITE_OTHER): Admitting: Medical-Surgical

## 2024-05-26 VITALS — BP 110/74 | HR 70 | Resp 20 | Ht 68.0 in | Wt 190.3 lb

## 2024-05-26 DIAGNOSIS — I7774 Dissection of vertebral artery: Secondary | ICD-10-CM

## 2024-05-26 DIAGNOSIS — Z23 Encounter for immunization: Secondary | ICD-10-CM

## 2024-05-26 DIAGNOSIS — F988 Other specified behavioral and emotional disorders with onset usually occurring in childhood and adolescence: Secondary | ICD-10-CM | POA: Diagnosis not present

## 2024-05-26 DIAGNOSIS — R5383 Other fatigue: Secondary | ICD-10-CM | POA: Diagnosis not present

## 2024-05-26 DIAGNOSIS — F32A Depression, unspecified: Secondary | ICD-10-CM | POA: Diagnosis not present

## 2024-05-26 DIAGNOSIS — Z5181 Encounter for therapeutic drug level monitoring: Secondary | ICD-10-CM

## 2024-05-26 DIAGNOSIS — Z0000 Encounter for general adult medical examination without abnormal findings: Secondary | ICD-10-CM

## 2024-05-26 DIAGNOSIS — Z79899 Other long term (current) drug therapy: Secondary | ICD-10-CM

## 2024-05-26 MED ORDER — ATORVASTATIN CALCIUM 10 MG PO TABS
10.0000 mg | ORAL_TABLET | Freq: Every day | ORAL | 3 refills | Status: AC
Start: 2024-05-26 — End: ?

## 2024-05-26 MED ORDER — BUPROPION HCL ER (XL) 300 MG PO TB24: 300.0000 mg | ORAL_TABLET | Freq: Every day | ORAL | 0 refills | Status: DC

## 2024-05-26 NOTE — Progress Notes (Signed)
 Complete physical exam  Patient: Shelly Walton   DOB: 06/06/1971   53 y.o. Female  MRN: [id number]  Subjective:    Chief Complaint  Patient presents with   Annual Exam    Shelly Walton is a 53 y.o. female who presents today for a complete physical exam. She reports consuming a general diet. Some intermittent exercise but not regularly. She generally feels fairly well. She reports sleeping poorly. She does have additional problems to discuss today.    Most recent fall risk assessment:    05/26/2024   11:44 AM  Fall Risk   Falls in the past year? 0  Number falls in past yr: 0  Injury with Fall? 0  Risk for fall due to : No Fall Risks  Follow up Falls evaluation completed     Most recent depression screenings:    05/26/2024   11:44 AM 11/03/2023   10:43 AM  PHQ 2/9 Scores  PHQ - 2 Score 0 0  PHQ- 9 Score 5 5    Vision:Within last year and Dental: No current dental problems and No regular dental care     Patient Care Team: Willo Mini, NP as PCP - General (Nurse Practitioner)   Outpatient Medications Prior to Visit  Medication Sig   Aspirin 81 MG CAPS Take 81 mg by mouth daily.   famotidine  (PEPCID ) 20 MG tablet Take 1 tablet twice daily for 7 days, then continue to use as needed twice daily   Ferrous Sulfate (IRON PO) Take 45 mg by mouth every other day.   Magnesium Glycinate 120 MG CAPS Take 150 mg by mouth daily.   omeprazole  (PRILOSEC) 20 MG capsule Take 1 capsule (20 mg total) by mouth daily.   [DISCONTINUED] atorvastatin  (LIPITOR) 10 MG tablet Take 1 tablet (10 mg total) by mouth daily.   [DISCONTINUED] buPROPion  (WELLBUTRIN  XL) 300 MG 24 hr tablet TAKE 1 TABLET BY MOUTH DAILY   No facility-administered medications prior to visit.    Review of Systems  Constitutional:  Positive for malaise/fatigue. Negative for chills, fever and weight loss.  HENT:  Positive for sore throat. Negative for congestion, ear pain, hearing loss and sinus pain.   Eyes:   Negative for blurred vision, photophobia and pain.  Respiratory:  Positive for cough. Negative for shortness of breath and wheezing.   Cardiovascular:  Negative for chest pain, palpitations and leg swelling.  Gastrointestinal:  Positive for heartburn. Negative for abdominal pain, constipation, diarrhea, nausea and vomiting.  Genitourinary:  Negative for dysuria, frequency and urgency.  Musculoskeletal:  Positive for back pain, joint pain, myalgias and neck pain. Negative for falls.  Skin:  Negative for itching and rash.  Neurological:  Positive for headaches. Negative for dizziness and weakness.  Endo/Heme/Allergies:  Negative for polydipsia. Does not bruise/bleed easily.  Psychiatric/Behavioral:  Positive for depression. Negative for substance abuse and suicidal ideas. The patient is nervous/anxious and has insomnia.      Objective:  `````````````````````````````````````  BP 110/74 (BP Location: Left Arm, Cuff Size: Normal)   Pulse 70   Resp 20   Ht 5' 8 (1.727 m)   Wt 190 lb 4.8 oz (86.3 kg)   SpO2 97%   BMI 28.94 kg/m    Physical Exam Vitals reviewed.  Constitutional:      General: She is not in acute distress.    Appearance: Normal appearance. She is not ill-appearing.  HENT:     Head: Normocephalic and atraumatic.     Right Ear:  Tympanic membrane, ear canal and external ear normal. There is no impacted cerumen.     Left Ear: Tympanic membrane, ear canal and external ear normal. There is no impacted cerumen.     Nose: Nose normal. No congestion or rhinorrhea.     Mouth/Throat:     Mouth: Mucous membranes are moist.     Pharynx: No oropharyngeal exudate or posterior oropharyngeal erythema.  Eyes:     General: No scleral icterus.       Right eye: No discharge.        Left eye: No discharge.     Extraocular Movements: Extraocular movements intact.     Conjunctiva/sclera: Conjunctivae normal.     Pupils: Pupils are equal, round, and reactive to light.  Neck:     Thyroid:  No thyromegaly.     Vascular: No carotid bruit or JVD.     Trachea: Trachea normal.  Cardiovascular:     Rate and Rhythm: Normal rate and regular rhythm.     Pulses: Normal pulses.     Heart sounds: Normal heart sounds. No murmur heard.    No friction rub. No gallop.  Pulmonary:     Effort: Pulmonary effort is normal. No respiratory distress.     Breath sounds: Normal breath sounds. No wheezing.  Abdominal:     General: Bowel sounds are normal. There is no distension.     Palpations: Abdomen is soft.     Tenderness: There is no abdominal tenderness. There is no guarding.  Musculoskeletal:        General: Normal range of motion.     Cervical back: Normal range of motion and neck supple.  Lymphadenopathy:     Cervical: No cervical adenopathy.  Skin:    General: Skin is warm and dry.  Neurological:     Mental Status: She is alert and oriented to person, place, and time.     Cranial Nerves: No cranial nerve deficit.  Psychiatric:        Mood and Affect: Mood normal.        Behavior: Behavior normal.        Thought Content: Thought content normal.        Judgment: Judgment normal.      No results found for any visits on 05/26/24.     Assessment & Plan:    Routine Health Maintenance and Physical Exam  Immunization History  Administered Date(s) Administered   Influenza,inj,Quad PF,6+ Mos 09/05/2021   Tdap 05/26/2024    Health Maintenance  Topic Date Due   Hepatitis B Vaccines (1 of 3 - 19+ 3-dose series) Never done   Zoster Vaccines- Shingrix (1 of 2) Never done   INFLUENZA VACCINE  06/03/2024   MAMMOGRAM  05/25/2026   Cervical Cancer Screening (HPV/Pap Cotest)  03/26/2028   Colonoscopy  05/26/2033   DTaP/Tdap/Td (2 - Td or Tdap) 05/26/2034   HPV VACCINES  Aged Out   Meningococcal B Vaccine  Aged Out   COVID-19 Vaccine  Discontinued   Hepatitis C Screening  Discontinued   HIV Screening  Discontinued    Discussed health benefits of physical activity, and  encouraged her to engage in regular exercise appropriate for her age and condition.   1. Annual physical exam (Primary) Checking labs as below.  Recommend updating dental care. Wellness information provided with AVS. - CBC - CMP14+EGFR  2. Encounter for monitoring statin therapy Checking labs as below.  Continue atorvastatin . - CMP14+EGFR - Lipid panel - atorvastatin  (LIPITOR) 10  MG tablet; Take 1 tablet (10 mg total) by mouth daily.  Dispense: 90 tablet; Refill: 3  3. Vertebral artery dissection (HCC) Ultrasound last year with no significant concerns.  Currently asymptomatic.  If symptoms arise, recommend CTA rather than repeat ultrasound.  4. Attention deficit disorder (ADD) in adult Wellbutrin  is helping with overwhelmed but not with focus.  Continue Wellbutrin  as prescribed.  Reviewed options for nonstimulant medications due to risk of stroke when on stimulant medications in the setting of vertebral artery dissection.  She will consider these and will let me know if she would like to start a nonstimulant option. - buPROPion  (WELLBUTRIN  XL) 300 MG 24 hr tablet; Take 1 tablet (300 mg total) by mouth daily.  Dispense: 30 tablet; Refill: 0  5. Mild depression Stable and well-controlled.  Continue Wellbutrin . - buPROPion  (WELLBUTRIN  XL) 300 MG 24 hr tablet; Take 1 tablet (300 mg total) by mouth daily.  Dispense: 30 tablet; Refill: 0  6. Fatigue, unspecified type History of thyroid dysfunction previously treated with levothyroxine but has been off of this for years.  With recent weight gain, fatigue, and hair loss, rechecking thyroid panel. - TSH+T4F+T3Free+ThyAbs+TPO+VD25  7. Need for tetanus booster Tdap given in office today. - Tdap vaccine greater than or equal to 7yo IM  Return in about 1 year (around 05/26/2025) for annual physical exam or sooner if needed.     Aurthur Wingerter, NP

## 2024-05-26 NOTE — Patient Instructions (Signed)
 Preventive Care 16-51 Years Old, Female  Preventive care refers to lifestyle choices and visits with your health care provider that can promote health and wellness. Preventive care visits are also called wellness exams.  What can I expect for my preventive care visit?  Counseling  Your health care provider may ask you questions about your:  Medical history, including:  Past medical problems.  Family medical history.  Pregnancy history.  Current health, including:  Menstrual cycle.  Method of birth control.  Emotional well-being.  Home life and relationship well-being.  Sexual activity and sexual health.  Lifestyle, including:  Alcohol, nicotine or tobacco, and drug use.  Access to firearms.  Diet, exercise, and sleep habits.  Work and work Astronomer.  Sunscreen use.  Safety issues such as seatbelt and bike helmet use.  Physical exam  Your health care provider will check your:  Height and weight. These may be used to calculate your BMI (body mass index). BMI is a measurement that tells if you are at a healthy weight.  Waist circumference. This measures the distance around your waistline. This measurement also tells if you are at a healthy weight and may help predict your risk of certain diseases, such as type 2 diabetes and high blood pressure.  Heart rate and blood pressure.  Body temperature.  Skin for abnormal spots.  What immunizations do I need?    Vaccines are usually given at various ages, according to a schedule. Your health care provider will recommend vaccines for you based on your age, medical history, and lifestyle or other factors, such as travel or where you work.  What tests do I need?  Screening  Your health care provider may recommend screening tests for certain conditions. This may include:  Lipid and cholesterol levels.  Diabetes screening. This is done by checking your blood sugar (glucose) after you have not eaten for a while (fasting).  Pelvic exam and Pap test.  Hepatitis B test.  Hepatitis C  test.  HIV (human immunodeficiency virus) test.  STI (sexually transmitted infection) testing, if you are at risk.  Lung cancer screening.  Colorectal cancer screening.  Mammogram. Talk with your health care provider about when you should start having regular mammograms. This may depend on whether you have a family history of breast cancer.  BRCA-related cancer screening. This may be done if you have a family history of breast, ovarian, tubal, or peritoneal cancers.  Bone density scan. This is done to screen for osteoporosis.  Talk with your health care provider about your test results, treatment options, and if necessary, the need for more tests.  Follow these instructions at home:  Eating and drinking    Eat a diet that includes fresh fruits and vegetables, whole grains, lean protein, and low-fat dairy products.  Take vitamin and mineral supplements as recommended by your health care provider.  Do not drink alcohol if:  Your health care provider tells you not to drink.  You are pregnant, may be pregnant, or are planning to become pregnant.  If you drink alcohol:  Limit how much you have to 0-1 drink a day.  Know how much alcohol is in your drink. In the [place] one drink equals one 12 oz bottle of beer (355 mL), one 5 oz glass of wine (148 mL), or one 1 oz glass of hard liquor (44 mL).  Lifestyle  Brush your teeth every morning and night with fluoride toothpaste. Floss one time each day.  Exercise for at least  30 minutes 5 or more days each week.  Do not use any products that contain nicotine or tobacco. These products include cigarettes, chewing tobacco, and vaping devices, such as e-cigarettes. If you need help quitting, ask your health care provider.  Do not use drugs.  If you are sexually active, practice safe sex. Use a condom or other form of protection to prevent STIs.  If you do not wish to become pregnant, use a form of birth control. If you plan to become pregnant, see your health care provider for a  prepregnancy visit.  Take aspirin only as told by your health care provider. Make sure that you understand how much to take and what form to take. Work with your health care provider to find out whether it is safe and beneficial for you to take aspirin daily.  Find healthy ways to manage stress, such as:  Meditation, yoga, or listening to music.  Journaling.  Talking to a trusted person.  Spending time with friends and family.  Minimize exposure to UV radiation to reduce your risk of skin cancer.  Safety  Always wear your seat belt while driving or riding in a vehicle.  Do not drive:  If you have been drinking alcohol. Do not ride with someone who has been drinking.  When you are tired or distracted.  While texting.  If you have been using any mind-altering substances or drugs.  Wear a helmet and other protective equipment during sports activities.  If you have firearms in your house, make sure you follow all gun safety procedures.  Seek help if you have been physically or sexually abused.  What's next?  Visit your health care provider once a year for an annual wellness visit.  Ask your health care provider how often you should have your eyes and teeth checked.  Stay up to date on all vaccines.  This information is not intended to replace advice given to you by your health care provider. Make sure you discuss any questions you have with your health care provider.  Document Revised: 04/17/2021 Document Reviewed: 04/17/2021  Elsevier Patient Education  2024 [employer].

## 2024-05-27 ENCOUNTER — Ambulatory Visit: Payer: Self-pay | Admitting: Medical-Surgical

## 2024-05-27 LAB — LIPID PANEL

## 2024-05-31 LAB — TSH+T4F+T3FREE+THYABS+TPO+VD25
Free T4: 1.41 ng/dL (ref 0.82–1.77)
T3, Free: 2.6 pg/mL (ref 2.0–4.4)
TSH: 2.73 u[IU]/mL (ref 0.450–4.500)
Thyroglobulin Antibody: <1 [IU]/mL (ref 0.0–0.9)
Thyroperoxidase Ab SerPl-aCnc: <9 [IU]/mL (ref 0–34)
Vit D, 25-Hydroxy: 36.6 ng/mL (ref 30.0–100.0)

## 2024-05-31 LAB — CMP14+EGFR
ALT: 14 IU/L (ref 0–32)
AST: 15 IU/L (ref 0–40)
Albumin: 4.6 g/dL (ref 3.8–4.9)
Alkaline Phosphatase: 60 IU/L (ref 44–121)
BUN/Creatinine Ratio: 13 (ref 9–23)
BUN: 13 mg/dL (ref 6–24)
Bilirubin Total: 0.2 mg/dL (ref 0.0–1.2)
CO2: 17 mmol/L — ABNORMAL LOW (ref 20–29)
Calcium: 9.2 mg/dL (ref 8.7–10.2)
Chloride: 104 mmol/L (ref 96–106)
Creatinine, Ser: 0.99 mg/dL (ref 0.57–1.00)
Globulin, Total: 2.1 g/dL (ref 1.5–4.5)
Glucose: 92 mg/dL (ref 70–99)
Potassium: 4.8 mmol/L (ref 3.5–5.2)
Sodium: 140 mmol/L (ref 134–144)
Total Protein: 6.7 g/dL (ref 6.0–8.5)
eGFR: 69 mL/min/1.73 (ref >59)

## 2024-05-31 LAB — LIPID PANEL
Chol/HDL Ratio: 2.9 ratio (ref 0.0–4.4)
Cholesterol, Total: 168 mg/dL (ref 100–199)
HDL: 57 mg/dL (ref >39)
LDL Chol Calc (NIH): 94 mg/dL (ref 0–99)
Triglycerides: 91 mg/dL (ref 0–149)
VLDL Cholesterol Cal: 17 mg/dL (ref 5–40)

## 2024-05-31 LAB — CBC
Hematocrit: 44.7 % (ref 34.0–46.6)
Hemoglobin: 14.3 g/dL (ref 11.1–15.9)
MCH: 28.2 pg (ref 26.6–33.0)
MCHC: 32 g/dL (ref 31.5–35.7)
MCV: 88 fL (ref 79–97)
Platelets: 373 x10E3/uL (ref 150–450)
RBC: 5.07 x10E6/uL (ref 3.77–5.28)
RDW: 12.7 % (ref 11.7–15.4)
WBC: 6.5 x10E3/uL (ref 3.4–10.8)

## 2024-06-01 MED ORDER — VILOXAZINE HCL ER 200 MG PO CP24: 200.0000 mg | ORAL_CAPSULE | Freq: Every day | ORAL | 1 refills | Status: DC

## 2024-06-02 ENCOUNTER — Telehealth: Payer: Self-pay

## 2024-06-02 ENCOUNTER — Other Ambulatory Visit (HOSPITAL_COMMUNITY): Payer: Self-pay

## 2024-06-02 NOTE — Telephone Encounter (Signed)
 Pharmacy Patient Advocate Encounter   Received notification from CoverMyMeds that prior authorization for Qelbree 200MG  er capsules is required/requested.   Insurance verification completed.   The patient is insured through [facility] .   Per test claim: PA required; PA submitted to above mentioned insurance via CoverMyMeds Key/confirmation #/EOC [id number] Status is pending

## 2024-06-03 ENCOUNTER — Other Ambulatory Visit (HOSPITAL_COMMUNITY): Payer: Self-pay

## 2024-06-03 NOTE — Telephone Encounter (Signed)
 Pharmacy Patient Advocate Encounter  Received notification from [facility] that Prior Authorization for Qelbree 200MG  er capsules  has been DENIED.  Full denial letter will be uploaded to the media tab. See denial reason below.   PA #/Case ID/Reference #: [id number]    DENIAL REASON: Patient does not meet requirements

## 2024-06-07 ENCOUNTER — Other Ambulatory Visit (HOSPITAL_COMMUNITY): Payer: Self-pay

## 2024-06-09 ENCOUNTER — Other Ambulatory Visit (HOSPITAL_COMMUNITY): Payer: Self-pay

## 2024-06-09 ENCOUNTER — Telehealth: Payer: Self-pay | Admitting: Pharmacist

## 2024-06-09 NOTE — Telephone Encounter (Signed)
 In order to proceed with an appeal for Qelbree, we will need the missing information: clinical rationale explaining why the patient is unable to use one of the insurance-preferred alternatives--guanfacine ER, atomoxetine, or clonidine ER.  Please advise.  Thank you, Devere Pandy, PharmD Clinical Pharmacist  [place]  Direct Dial: [phone]

## 2024-07-11 ENCOUNTER — Encounter: Payer: Self-pay | Admitting: Medical-Surgical

## 2024-07-21 ENCOUNTER — Other Ambulatory Visit: Payer: Self-pay | Admitting: Medical-Surgical

## 2024-07-21 DIAGNOSIS — F32A Depression, unspecified: Secondary | ICD-10-CM

## 2024-07-21 DIAGNOSIS — F988 Other specified behavioral and emotional disorders with onset usually occurring in childhood and adolescence: Secondary | ICD-10-CM

## 2024-08-17 ENCOUNTER — Other Ambulatory Visit: Payer: Self-pay | Admitting: Medical-Surgical

## 2024-08-17 DIAGNOSIS — F988 Other specified behavioral and emotional disorders with onset usually occurring in childhood and adolescence: Secondary | ICD-10-CM

## 2024-08-17 DIAGNOSIS — F32A Depression, unspecified: Secondary | ICD-10-CM

## 2024-08-23 ENCOUNTER — Other Ambulatory Visit: Payer: Self-pay | Admitting: Nurse Practitioner

## 2024-08-23 DIAGNOSIS — K219 Gastro-esophageal reflux disease without esophagitis: Secondary | ICD-10-CM

## 2024-10-07 ENCOUNTER — Ambulatory Visit: Admitting: Medical-Surgical

## 2024-10-07 ENCOUNTER — Encounter: Payer: Self-pay | Admitting: Medical-Surgical

## 2024-10-07 VITALS — BP 114/77 | HR 67 | Resp 20 | Ht 68.0 in | Wt 190.1 lb

## 2024-10-07 DIAGNOSIS — L03032 Cellulitis of left toe: Secondary | ICD-10-CM

## 2024-10-07 DIAGNOSIS — M79621 Pain in right upper arm: Secondary | ICD-10-CM | POA: Diagnosis not present

## 2024-10-07 DIAGNOSIS — M25531 Pain in right wrist: Secondary | ICD-10-CM | POA: Diagnosis not present

## 2024-10-07 DIAGNOSIS — K219 Gastro-esophageal reflux disease without esophagitis: Secondary | ICD-10-CM

## 2024-10-07 MED ORDER — DOXYCYCLINE HYCLATE 100 MG PO TABS: 100.0000 mg | ORAL_TABLET | Freq: Two times a day (BID) | ORAL | 0 refills | Status: AC

## 2024-10-07 MED ORDER — FLUCONAZOLE 150 MG PO TABS: 150.0000 mg | ORAL_TABLET | Freq: Once | ORAL | 0 refills | Status: AC

## 2024-10-07 MED ORDER — FAMOTIDINE 20 MG PO TABS: ORAL_TABLET | ORAL | 3 refills | Status: AC

## 2024-10-07 MED ORDER — OMEPRAZOLE 20 MG PO CPDR: 20.0000 mg | DELAYED_RELEASE_CAPSULE | Freq: Every day | ORAL | 3 refills | Status: AC | PRN

## 2024-10-07 MED ORDER — MELOXICAM 15 MG PO TABS: 15.0000 mg | ORAL_TABLET | Freq: Every day | ORAL | 0 refills | Status: AC

## 2024-10-07 NOTE — Progress Notes (Signed)
 Established patient visit   History of Present Illness   Discussed the use of AI scribe software for clinical note transcription with the patient, who gave verbal consent to proceed.  History of Present Illness   Shelly Walton is a 53 year old female who presents with a painful, red, and swollen toenail.  Acute left great toenail pain and swelling - Onset this morning of pain, redness, and swelling of the toenail, worsened by contact with covers - No drainage present - Bruise-like darkness at the nail bed for one week - History of possible trauma two weeks ago (jamming toe or dropping a small plastic hair clip) - Toenail appeared darker at the bed for about a week, but looked normal last night  Upper extremity pain and weakness - Wrist and arm pain after resuming tennis - Pain localized to the backside of the upper arm - Occurs only with certain movements (flexion of the shoulder, reaching across her body) - Described as weakness and discomfort - Advil and compression sleeve provide some relief - Symptoms have persisted for a few months with periods of improvement and worsening  Gastrointestinal symptoms and medication use - Daily omeprazole  20mg  and as-needed Pepcid  20mg  for reflux-related symptoms - Concern regarding long-term omeprazole  use and potential side effects     Physical Exam   Physical Exam Vitals reviewed.  Constitutional:      General: She is not in acute distress.    Appearance: Normal appearance. She is not ill-appearing.  HENT:     Head: Normocephalic and atraumatic.  Cardiovascular:     Rate and Rhythm: Normal rate and regular rhythm.  Pulmonary:     Effort: Pulmonary effort is normal. No respiratory distress.  Skin:    General: Skin is warm and dry.     Findings: Erythema (proximal border of the left great toenail) present.  Neurological:     Mental Status: She is alert and oriented to person, place, and time.  Psychiatric:        Mood  and Affect: Mood normal.        Behavior: Behavior normal.        Thought Content: Thought content normal.        Judgment: Judgment normal.    Assessment & Plan     Paronychia of the left great toe - Prescribed doxycycline  BID for 7 days. - Advised warm water soaks with Epsom salts 2-3 times daily. - Instructed to monitor for improvement in redness and swelling within 24-48 hours of starting antibiotics. - Discussed potential for pus collection and peeling of dead skin as part of healing process. - Advised to seek further evaluation if no improvement after completing antibiotics.  Right upper arm/wrist/hand pain Likely muscular or tendon strain related to tennis activity. - Prescribed meloxicam  15mg  daily for 2 weeks, then daily as needed. - Advised use of compression sleeve. - Recommended stretching exercises and activity modification. - Will consider formal physical therapy or imaging if no improvement after 6 weeks.  Gastroesophageal reflux disease Symptoms improved with medication but recurred upon discontinuation. Discussed risks of long-term PPI use, including bone loss and GI infections. - Prescribed omeprazole  20mg  daily as needed, with Pepcid  20mg  BID as primary treatment. - Discussed risks and benefits of long-term PPI use. - Advised on lifestyle modifications to manage symptoms.  General Health Maintenance Discussed pneumonia and shingles vaccines. Pneumonia vaccine guidelines updated to start at age 39. - Consider pneumonia vaccine as  per updated guidelines. - Consider shingles vaccine at a later date.     Follow up   Return if symptoms worsen or fail to improve. __________________________________ Zada FREDRIK Palin, DNP, APRN, FNP-BC Primary Care and Sports Medicine [facility]

## 2024-10-31 ENCOUNTER — Ambulatory Visit: Payer: Self-pay

## 2024-11-01 ENCOUNTER — Ambulatory Visit: Admitting: Medical-Surgical

## 2024-11-01 ENCOUNTER — Encounter: Payer: Self-pay | Admitting: Medical-Surgical

## 2024-11-01 VITALS — BP 123/80 | HR 78 | Temp 97.8°F | Resp 20 | Ht 68.0 in | Wt 189.0 lb

## 2024-11-01 DIAGNOSIS — K1120 Sialoadenitis, unspecified: Secondary | ICD-10-CM

## 2024-11-01 MED ORDER — CEFDINIR 300 MG PO CAPS: 300.0000 mg | ORAL_CAPSULE | Freq: Two times a day (BID) | ORAL | 0 refills | Status: AC

## 2024-11-01 MED ORDER — FLUCONAZOLE 150 MG PO TABS: 150.0000 mg | ORAL_TABLET | Freq: Once | ORAL | 0 refills | Status: AC

## 2024-11-01 NOTE — Progress Notes (Signed)
 "       Established patient visit   History of Present Illness   Discussed the use of AI scribe software for clinical note transcription with the patient, who gave verbal consent to proceed.  History of Present Illness   Shelly Walton is a 53 year old female who presents with right ear and jaw pain, swelling, and pressure.  Right ear and jaw pain, swelling, and pressure - Onset Sunday night with right jaw swelling noted the following morning - Pain is uncomfortable but not severe - Area is tender to touch - Pressure sensation in the right ear - Warm compresses and Motrin provide some relief - No medication taken today  Oropharyngeal symptoms - Sore throat present - Congestion began approximately one week ago, now mostly resolved - Nyquil used at night for congestion  Neurologic symptoms - Brief episode of severe nausea and altered sound perception shortly after making her appointment for today - Felt as if she might pass out while home alone with her children - Symptoms improved by the time EMS arrived - No vomiting occurred - 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  Constitutional:      General: She is not in acute distress.    Appearance: Normal appearance.  HENT:     Head: Normocephalic and atraumatic.      Comments: Area of swelling, mildly tender to palpation Cardiovascular:     Rate and Rhythm: Normal rate and regular rhythm.     Pulses: Normal pulses.     Heart sounds: Normal heart sounds. No murmur heard.    No friction rub. No gallop.  Pulmonary:     Effort: Pulmonary effort is normal. No respiratory  distress.     Breath sounds: Normal breath sounds. No wheezing.  Lymphadenopathy:     Cervical: Cervical adenopathy (right) present.  Skin:    General: Skin is warm and dry.  Neurological:     Mental Status: She is alert and oriented to person, place, and time.  Psychiatric:        Mood and Affect: Mood normal.        Behavior: Behavior normal.        Thought Content: Thought content normal.        Judgment: Judgment normal.    Assessment & Plan   Parotiditis Acute parotiditis with swelling and discomfort near the right ear and jaw. Differential includes bacterial infection or salivary stone obstruction. Timing suggests secondary bacterial infection post-viral illness. - Prescribed Omnicef (cefdinir) 300 mg twice daily for 7 days. - Prescribed Diflucan  (fluconazole ) to prevent yeast infection due to antibiotic use. - Advised warm compresses and massage to the affected area. - Recommended sour candies to stimulate saliva production and potentially dislodge any salivary stone. - Instructed to monitor for improvement within 24-48 hours and complete the antibiotic course for full resolution.  Vasovagal syncope Recent vasovagal syncope episode with nausea and muffled hearing, resolved spontaneously. Likely triggered by stress or vagus nerve stimulation. - Advised staying hydrated and monitoring for recurrent episodes. - Instructed to lie down if symptoms recur to prevent syncope and injury.    Follow up   Return  if symptoms worsen or fail to improve. __________________________________ Zada FREDRIK Palin, DNP, APRN, FNP-BC Primary Care and Sports Medicine [facility] "

## 2024-11-05 ENCOUNTER — Other Ambulatory Visit: Payer: Self-pay | Admitting: Medical-Surgical

## 2024-11-05 DIAGNOSIS — F32A Depression, unspecified: Secondary | ICD-10-CM

## 2024-11-05 DIAGNOSIS — F988 Other specified behavioral and emotional disorders with onset usually occurring in childhood and adolescence: Secondary | ICD-10-CM

## 2024-11-25 ENCOUNTER — Telehealth: Admitting: Family Medicine

## 2024-11-25 DIAGNOSIS — L089 Local infection of the skin and subcutaneous tissue, unspecified: Secondary | ICD-10-CM | POA: Diagnosis not present

## 2024-11-25 MED ORDER — CEPHALEXIN 500 MG PO CAPS: 500.0000 mg | ORAL_CAPSULE | Freq: Three times a day (TID) | ORAL | 0 refills | Status: AC

## 2024-11-25 NOTE — Progress Notes (Signed)
 " Virtual Visit Consent   Shelly Walton, you are scheduled for a virtual visit with a [facility] provider today. Just as with appointments in the office, your consent must be obtained to participate. Your consent will be active for this visit and any virtual visit you may have with one of our providers in the next 365 days. If you have a MyChart account, a copy of this consent can be sent to you electronically.  As this is a virtual visit, video technology does not allow for your provider to perform a traditional examination. This may limit your provider's ability to fully assess your condition. If your provider identifies any concerns that need to be evaluated in person or the need to arrange testing (such as labs, EKG, etc.), we will make arrangements to do so. Although advances in technology are sophisticated, we cannot ensure that it will always work on either your end or our end. If the connection with a video visit is poor, the visit may have to be switched to a telephone visit. With either a video or telephone visit, we are not always able to ensure that we have a secure connection.  By engaging in this virtual visit, you consent to the provision of healthcare and authorize for your insurance to be billed (if applicable) for the services provided during this visit. Depending on your insurance coverage, you may receive a charge related to this service.  I need to obtain your verbal consent now. Are you willing to proceed with your visit today? Shelly Walton has provided verbal consent on 11/25/2024 for a virtual visit (video or telephone). [name], FNP  Date: 11/25/2024 6:55 PM   Virtual Visit via Video Note   I, [name], connected with  Shelly Walton  ([id number], 07-28-1971) on 11/25/24 at  7:00 PM EST by a video-enabled telemedicine application and verified that I am speaking with the correct person using two identifiers.  Location: Patient: Virtual Visit Location  Patient: Home Provider: Virtual Visit Location Provider: Home Office   I discussed the limitations of evaluation and management by telemedicine and the availability of in person appointments. The patient expressed understanding and agreed to proceed.    History of Present Illness: Shelly Walton is a 54 y.o. who identifies as a female who was assigned female at birth, and is being seen today for redness, swelling and warmth at base of left great toe. No fever or drainage .  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  Resting comfortably  at home.  Head is normocephalic, atraumatic.  No labored breathing.  Speech is clear and coherent with logical content.  Patient is alert and oriented at baseline.  Redness and swelling noted, base left great toe.    Assessment and Plan: 1. Infection of great toe (Primary)  Warm salt water soaks, keep clean and dry, UC as needed or derm for reoccurrances.   Follow Up Instructions: I discussed the assessment and treatment plan with the patient. The patient was provided an opportunity to ask questions and all were answered. The patient agreed with the plan and demonstrated an understanding of the instructions.  A copy of instructions were sent to the patient via MyChart unless otherwise noted  below.     The patient was advised to call back or seek an in-person evaluation if the symptoms worsen or if the condition fails to improve as anticipated.    Hazleigh Mccleave, FNP     [1] No Known Allergies [2]  Current Outpatient Medications:    Aspirin 81 MG CAPS, Take 81 mg by mouth daily., Disp: , Rfl:    atorvastatin  (LIPITOR) 10 MG tablet, Take 1  tablet (10 mg total) by mouth daily., Disp: 90 tablet, Rfl: 3   buPROPion  (WELLBUTRIN  XL) 300 MG 24 hr tablet, TAKE 1 TABLET BY MOUTH DAILY, Disp: 90 tablet, Rfl: 0   cefdinir  (OMNICEF ) 300 MG capsule, Take 1 capsule (300 mg total) by mouth 2 (two) times daily., Disp: 14 capsule, Rfl: 0   famotidine  (PEPCID ) 20 MG tablet, TAKE 1 TABLET BY MOUTH 2 TIMES A DAY FOR 7 DAYS; THEN CONTINUE TO USE 2 TIMES A DAY AS NEEDED, Disp: 180 tablet, Rfl: 3   Ferrous Sulfate (IRON PO), Take 45 mg by mouth every other day., Disp: , Rfl:    Magnesium Glycinate 120 MG CAPS, Take 150 mg by mouth daily., Disp: , Rfl:    meloxicam  (MOBIC ) 15 MG tablet, Take 1 tablet (15 mg total) by mouth daily., Disp: 30 tablet, Rfl: 0   omeprazole  (PRILOSEC) 20 MG capsule, Take 1 capsule (20 mg total) by mouth daily as needed., Disp: 30 capsule, Rfl: 3  "

## 2024-11-25 NOTE — Patient Instructions (Signed)
